# Patient Record
Sex: Female | Born: 1955 | Race: White | Hispanic: No | Marital: Married | Smoking: Never smoker
Health system: Southern US, Community
[De-identification: ages and names within clinical notes are randomized; demographics above are authoritative.]

## PROBLEM LIST (undated history)

## (undated) HISTORY — PX: TONSILLECTOMY: SUR1361

## (undated) HISTORY — PX: TENDON REPAIR: SHX5111

## (undated) HISTORY — PX: FOOT SURGERY: SHX648

## (undated) HISTORY — PX: ROTATOR CUFF REPAIR: SHX139

---

## 2006-03-13 ENCOUNTER — Inpatient Hospital Stay (HOSPITAL_COMMUNITY): Admission: AD | Admit: 2006-03-13 | Discharge: 2006-03-13 | Payer: Self-pay | Admitting: Obstetrics & Gynecology

## 2007-11-25 ENCOUNTER — Ambulatory Visit: Payer: Self-pay | Admitting: Internal Medicine

## 2010-08-10 ENCOUNTER — Ambulatory Visit: Payer: Self-pay

## 2011-06-14 ENCOUNTER — Ambulatory Visit: Payer: Self-pay

## 2011-11-22 ENCOUNTER — Emergency Department (HOSPITAL_BASED_OUTPATIENT_CLINIC_OR_DEPARTMENT_OTHER)
Admission: EM | Admit: 2011-11-22 | Discharge: 2011-11-22 | Disposition: A | Discharge status: Home / Self Care | Payer: Worker's Compensation | Attending: Emergency Medicine | Admitting: Emergency Medicine

## 2011-11-22 ENCOUNTER — Encounter (HOSPITAL_BASED_OUTPATIENT_CLINIC_OR_DEPARTMENT_OTHER): Payer: Self-pay

## 2011-11-22 ENCOUNTER — Emergency Department (INDEPENDENT_AMBULATORY_CARE_PROVIDER_SITE_OTHER): Payer: Worker's Compensation

## 2011-11-22 DIAGNOSIS — S298XXA Other specified injuries of thorax, initial encounter: Secondary | ICD-10-CM

## 2011-11-22 DIAGNOSIS — W010XXA Fall on same level from slipping, tripping and stumbling without subsequent striking against object, initial encounter: Secondary | ICD-10-CM

## 2011-11-22 DIAGNOSIS — R079 Chest pain, unspecified: Secondary | ICD-10-CM

## 2011-11-22 DIAGNOSIS — W19XXXA Unspecified fall, initial encounter: Secondary | ICD-10-CM

## 2011-11-22 DIAGNOSIS — Y9329 Activity, other involving ice and snow: Secondary | ICD-10-CM | POA: Insufficient documentation

## 2011-11-22 DIAGNOSIS — S20219A Contusion of unspecified front wall of thorax, initial encounter: Secondary | ICD-10-CM | POA: Insufficient documentation

## 2011-11-22 DIAGNOSIS — N644 Mastodynia: Secondary | ICD-10-CM

## 2011-11-22 MED ORDER — HYDROCODONE-ACETAMINOPHEN 5-500 MG PO TABS: 1.0000 | ORAL_TABLET | Freq: Four times a day (QID) | ORAL | Status: AC | PRN

## 2011-11-22 MED ORDER — HYDROCODONE-ACETAMINOPHEN 5-325 MG PO TABS
2.0000 | ORAL_TABLET | Freq: Once | ORAL | Status: AC
  Administered 2011-11-22: 2 via ORAL
  Filled 2011-11-22: qty 2

## 2011-11-22 NOTE — ED Notes (Signed)
Patient transported to X-ray 

## 2011-11-22 NOTE — ED Notes (Signed)
Pt reports she fell on the ice onto right breast.  She c/o right breast and right rib pain associated with SHOB.

## 2011-11-22 NOTE — ED Notes (Signed)
MD at bedside. 

## 2011-11-22 NOTE — ED Notes (Signed)
Pt ambulatory to BR

## 2011-11-22 NOTE — ED Provider Notes (Addendum)
History     CSN: [id number]  Arrival date & time 11/22/11  4098   First MD Initiated Contact with Patient 11/22/11 1028      Chief Complaint  Patient presents with  . Fall  . Breast Pain  . Rib Injury    (Consider location/radiation/quality/duration/timing/severity/associated sxs/prior treatment) Patient is a 56 y.o. female presenting with fall. The history is provided by the patient.  Fall Incident onset: today. The fall occurred while walking (slipped on ice and fell). She fell from a height of 1 to 2 ft. She landed on concrete. Point of impact: right breast. Pain location: right chest wall. The pain is moderate. She was ambulatory at the scene. She has tried nothing for the symptoms.    History reviewed. No pertinent past medical history.  Past Surgical History  Procedure Date  . Tonsillectomy   . Tendon repair     No family history on file.  History  Substance Use Topics  . Smoking status: Never Smoker   . Smokeless tobacco: Never Used  . Alcohol Use: No    OB History    Grav Para Term Preterm Abortions TAB SAB Ect Mult Living                  Review of Systems  All other systems reviewed and are negative.    Allergies  Penicillins  Home Medications  No current outpatient prescriptions on file.  BP 155/73  Pulse 55  Temp(Src) 97.7 F (36.5 C) (Oral)  Resp 16  Ht 5\' 3"  (1.6 m)  Wt 213 lb (96.616 kg)  BMI 37.73 kg/m2  SpO2 100%  Physical Exam  Nursing note and vitals reviewed. Constitutional: She is oriented to person, place, and time. She appears well-developed and well-nourished. No distress.  HENT:  Head: Normocephalic and atraumatic.  Neck: Normal range of motion. Neck supple.  Cardiovascular: Normal rate and regular rhythm.   No murmur heard. Pulmonary/Chest: Effort normal and breath sounds normal. No respiratory distress.       There is ttp over the right breast and ribs.  Abdominal: Soft. Bowel sounds are normal. She exhibits no  distension. There is no tenderness.  Musculoskeletal: Normal range of motion.  Neurological: She is alert and oriented to person, place, and time.  Skin: Skin is warm and dry. She is not diaphoretic.    ED Course  Procedures (including critical care time)  Labs Reviewed - No data to display Dg Chest 2 View  11/22/2011  *RADIOLOGY REPORT*  Clinical Data: Larey Seat on ice with chest and breast pain  CHEST - 2 VIEW  Comparison: None.  Findings: The lungs are clear.  No pneumothorax is seen. Mediastinal contours are normal.  The heart is within upper limits of normal.  No acute fracture is seen.  IMPRESSION: No active lung disease.  No acute fracture is noted.  Consider rib detail films if warranted clinically.  Original Report Authenticated By: Juline Patch, M.D.     No diagnosis found.    MDM  No evidence of rib fracture or ptx.  Will discharge with pain meds, time.        Geoffery Lyons, MD 11/22/11 1035  Geoffery Lyons, MD 12/06/11 [phone]

## 2011-11-26 ENCOUNTER — Other Ambulatory Visit: Payer: Self-pay | Admitting: Occupational Medicine

## 2011-11-26 ENCOUNTER — Ambulatory Visit: Payer: Worker's Compensation

## 2011-11-26 DIAGNOSIS — R52 Pain, unspecified: Secondary | ICD-10-CM

## 2012-03-10 ENCOUNTER — Ambulatory Visit: Payer: Self-pay | Admitting: Internal Medicine

## 2012-04-06 ENCOUNTER — Ambulatory Visit: Payer: Self-pay

## 2012-08-27 ENCOUNTER — Ambulatory Visit: Payer: Self-pay | Admitting: Physician Assistant

## 2012-09-23 ENCOUNTER — Ambulatory Visit: Payer: Self-pay | Admitting: Internal Medicine

## 2012-11-18 IMAGING — CR DG HUMERUS 2V *R*
3 series · 3 of 3 positions shown · non-contrast
Comparison: 11/26/2011

CLINICAL DATA: The patient fell 11/22/2011.  Pain.

RIGHT HUMERUS - 2+ VIEW

[view not recorded (1 of 3)]
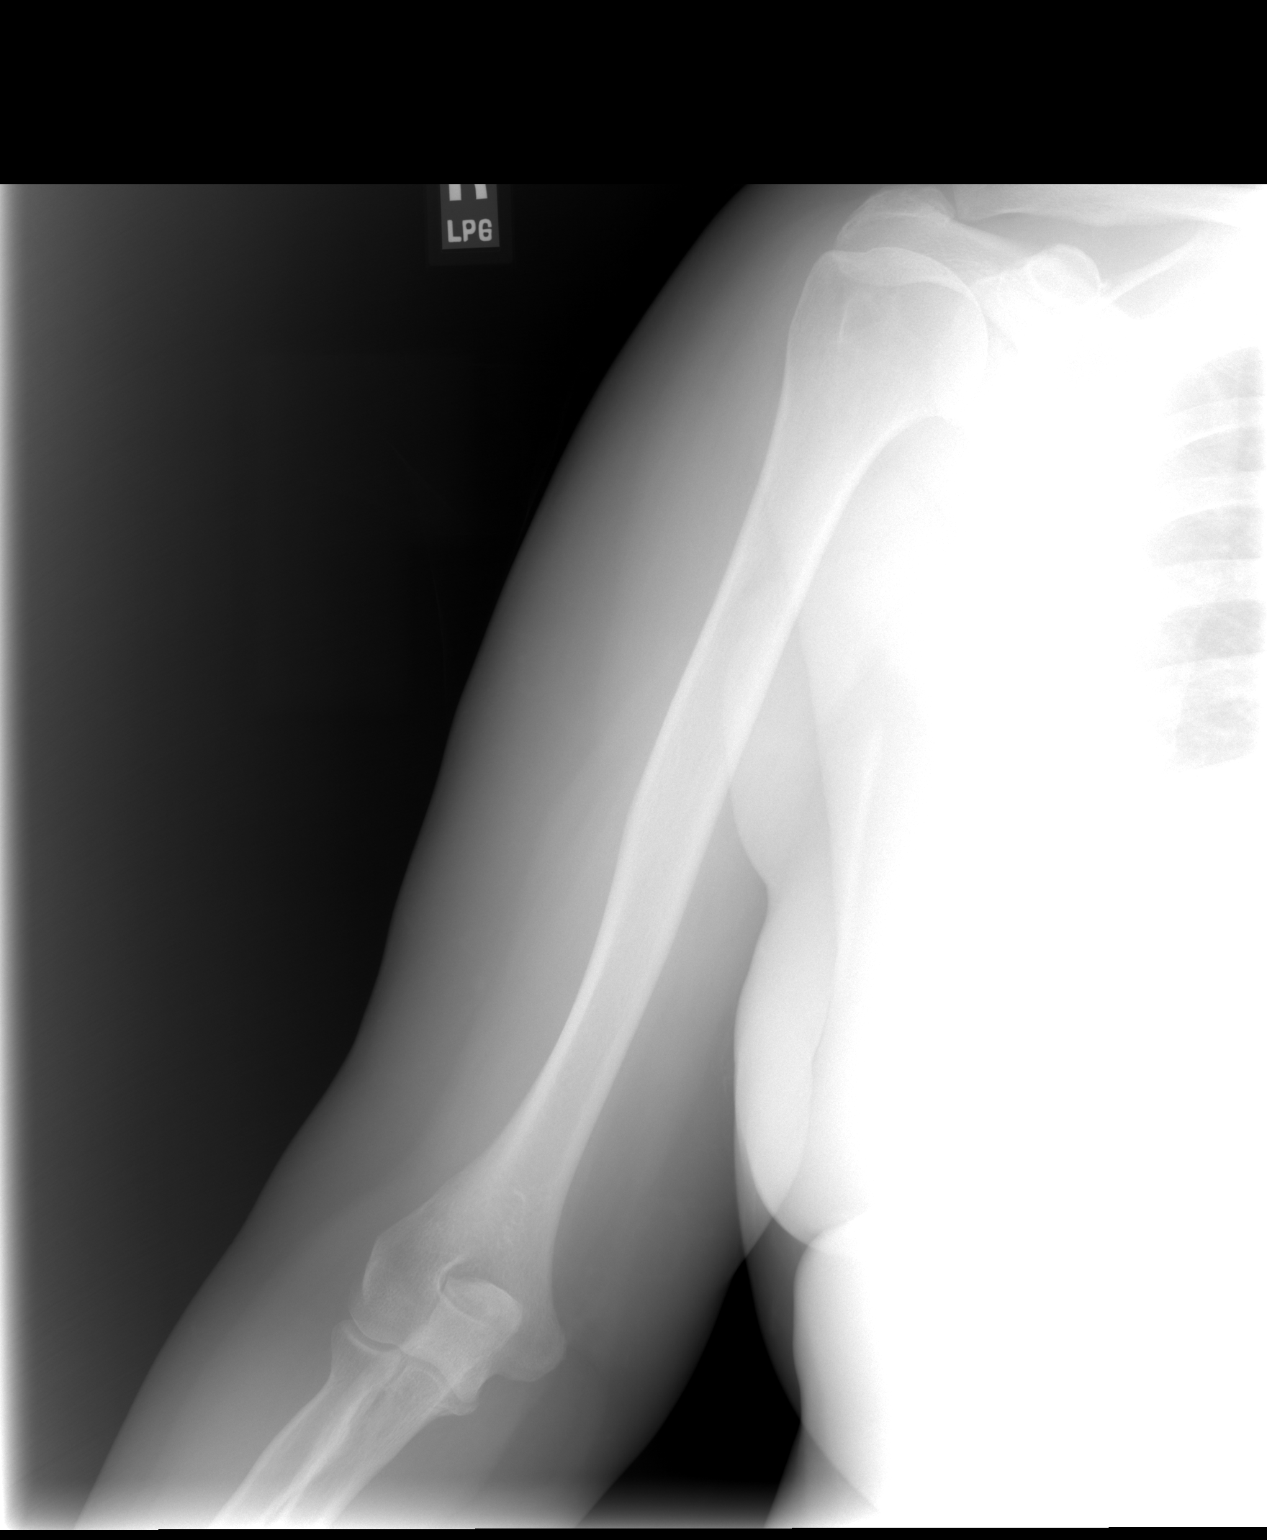

[view not recorded (2 of 3)]
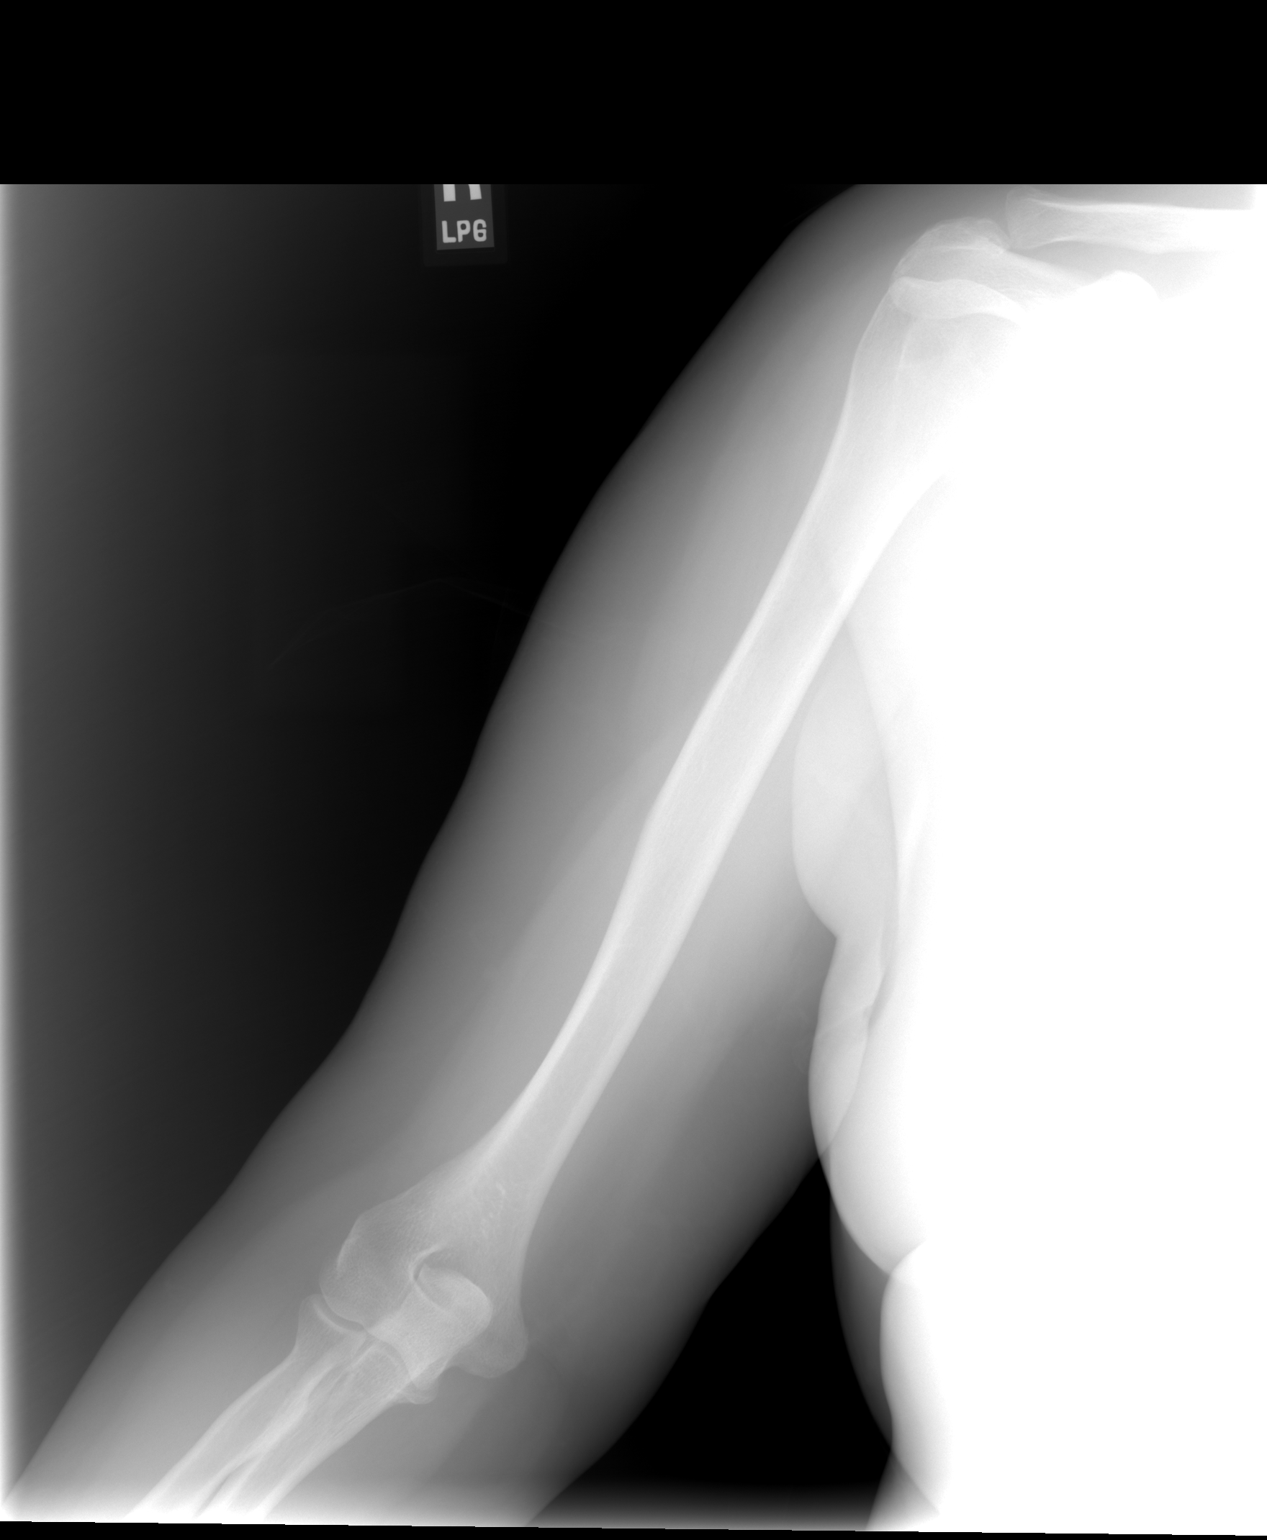

[view not recorded (3 of 3)]
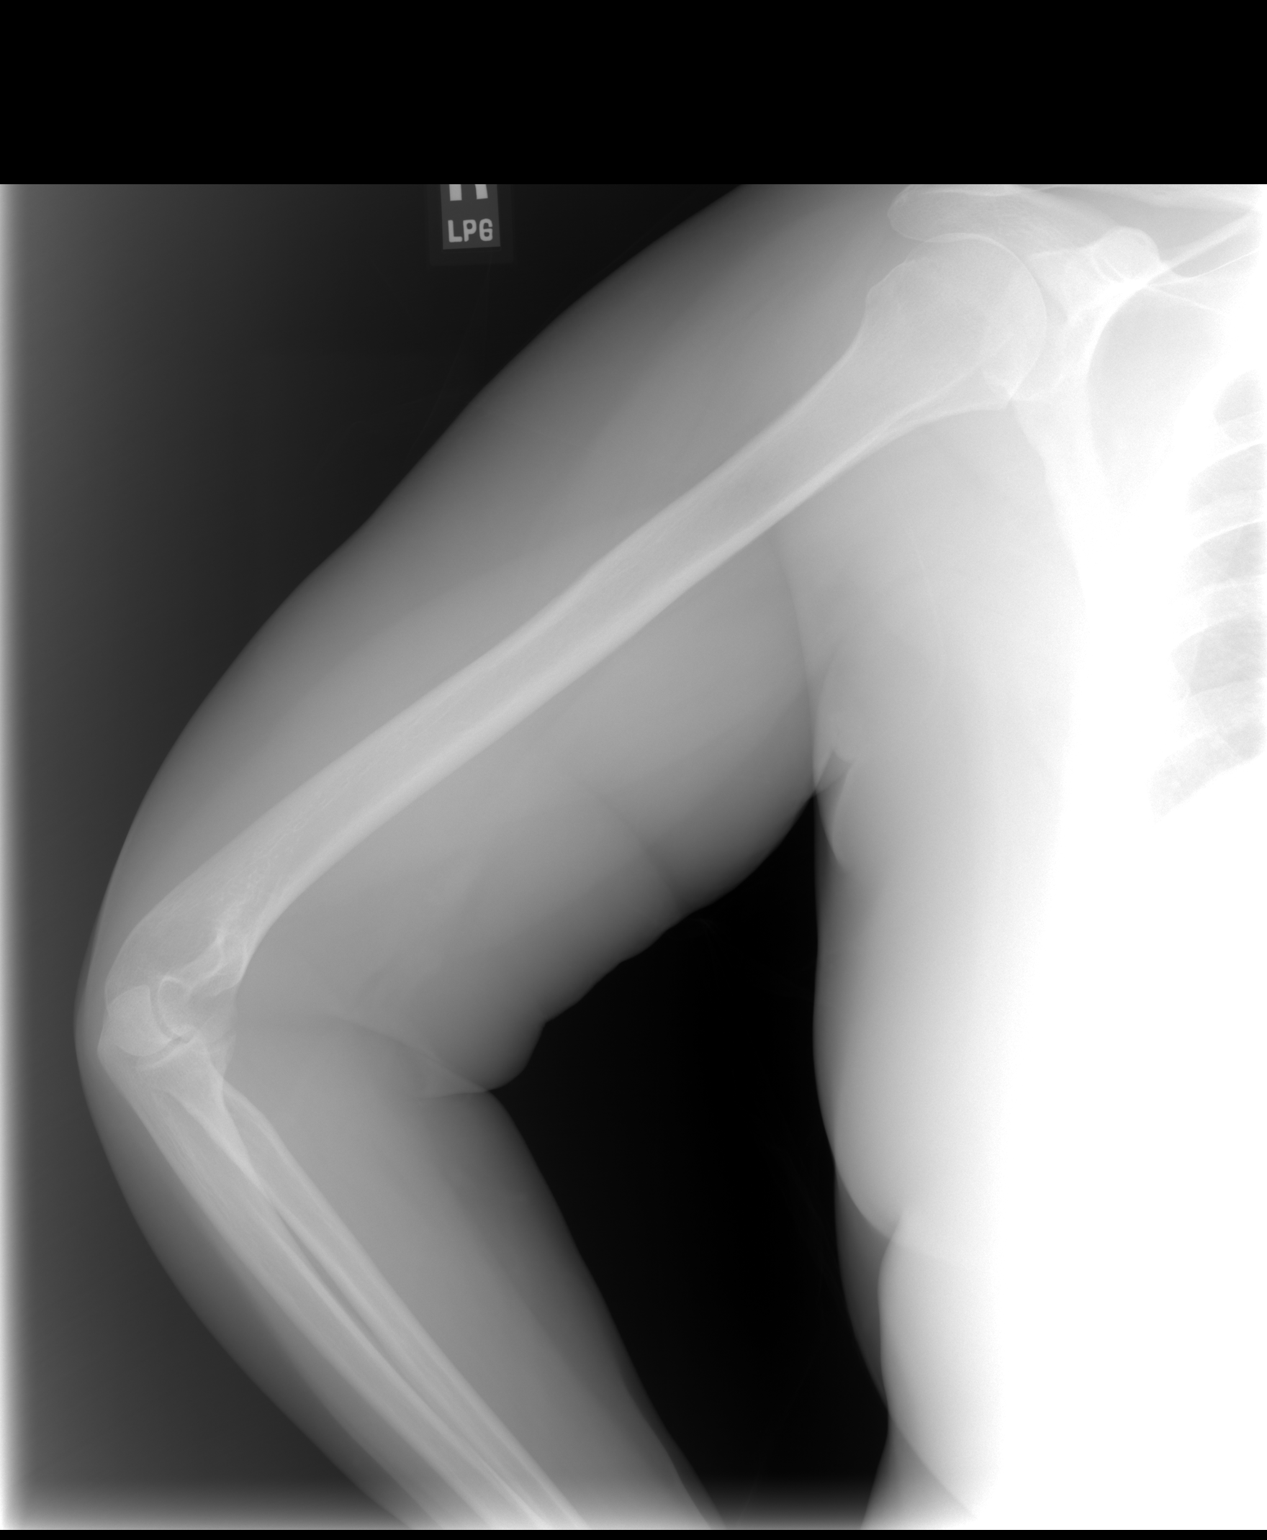

[3 of 3 positions shown; findings below may reference images not displayed]

FINDINGS: There is no evidence for acute fracture or dislocation.
No soft tissue foreign body or gas identified.
IMPRESSION: Negative exam.

## 2015-09-04 ENCOUNTER — Emergency Department
Admission: EM | Admit: 2015-09-04 | Discharge: 2015-09-05 | Disposition: A | Discharge status: Home / Self Care | Payer: 59 | Attending: Emergency Medicine | Admitting: Emergency Medicine

## 2015-09-04 ENCOUNTER — Emergency Department: Payer: 59

## 2015-09-04 ENCOUNTER — Encounter: Payer: Self-pay | Admitting: Emergency Medicine

## 2015-09-04 DIAGNOSIS — S79912A Unspecified injury of left hip, initial encounter: Secondary | ICD-10-CM | POA: Insufficient documentation

## 2015-09-04 DIAGNOSIS — Y998 Other external cause status: Secondary | ICD-10-CM | POA: Insufficient documentation

## 2015-09-04 DIAGNOSIS — Y9389 Activity, other specified: Secondary | ICD-10-CM | POA: Insufficient documentation

## 2015-09-04 DIAGNOSIS — Y92410 Unspecified street and highway as the place of occurrence of the external cause: Secondary | ICD-10-CM | POA: Diagnosis not present

## 2015-09-04 DIAGNOSIS — Z880 Allergy status to penicillin: Secondary | ICD-10-CM | POA: Insufficient documentation

## 2015-09-04 DIAGNOSIS — T148 Other injury of unspecified body region: Secondary | ICD-10-CM | POA: Insufficient documentation

## 2015-09-04 DIAGNOSIS — T148XXA Other injury of unspecified body region, initial encounter: Secondary | ICD-10-CM

## 2015-09-04 DIAGNOSIS — S199XXA Unspecified injury of neck, initial encounter: Secondary | ICD-10-CM | POA: Diagnosis present

## 2015-09-04 LAB — COMPREHENSIVE METABOLIC PANEL
ALT: 13 U/L — AB (ref 14–54)
AST: 18 U/L (ref 15–41)
Albumin: 3.9 g/dL (ref 3.5–5.0)
Alkaline Phosphatase: 75 U/L (ref 38–126)
Anion gap: 3 — ABNORMAL LOW (ref 5–15)
BILIRUBIN TOTAL: 0.9 mg/dL (ref 0.3–1.2)
BUN: 11 mg/dL (ref 6–20)
CO2: 29 mmol/L (ref 22–32)
CREATININE: 0.74 mg/dL (ref 0.44–1.00)
Calcium: 8.8 mg/dL — ABNORMAL LOW (ref 8.9–10.3)
Chloride: 109 mmol/L (ref 101–111)
GFR calc Af Amer: >60 mL/min (ref >60)
Glucose, Bld: 95 mg/dL (ref 65–99)
POTASSIUM: 3.7 mmol/L (ref 3.5–5.1)
Sodium: 141 mmol/L (ref 135–145)
TOTAL PROTEIN: 6.6 g/dL (ref 6.5–8.1)

## 2015-09-04 LAB — TYPE AND SCREEN
ABO/RH(D): O POS
Antibody Screen: NEGATIVE

## 2015-09-04 LAB — CBC WITH DIFFERENTIAL/PLATELET
BASOS ABS: 0 10*3/uL (ref 0–0.1)
Basophils Relative: 1 %
Eosinophils Absolute: 0.1 10*3/uL (ref 0–0.7)
Eosinophils Relative: 2 %
HEMATOCRIT: 40 % (ref 35.0–47.0)
Hemoglobin: 13.9 g/dL (ref 12.0–16.0)
LYMPHS ABS: 2.3 10*3/uL (ref 1.0–3.6)
LYMPHS PCT: 39 %
MCH: 32.1 pg (ref 26.0–34.0)
MCHC: 34.6 g/dL (ref 32.0–36.0)
MCV: 92.7 fL (ref 80.0–100.0)
MONO ABS: 0.5 10*3/uL (ref 0.2–0.9)
Monocytes Relative: 9 %
NEUTROS ABS: 2.9 10*3/uL (ref 1.4–6.5)
Neutrophils Relative %: 49 %
Platelets: 299 10*3/uL (ref 150–440)
RBC: 4.32 MIL/uL (ref 3.80–5.20)
RDW: 12.1 % (ref 11.5–14.5)
WBC: 5.7 10*3/uL (ref 3.6–11.0)

## 2015-09-04 MED ORDER — IOHEXOL 300 MG/ML  SOLN
100.0000 mL | Freq: Once | INTRAMUSCULAR | Status: AC | PRN
  Administered 2015-09-04: 100 mL via INTRAVENOUS
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
## 2015-09-05 LAB — ABO/RH: ABO/RH(D): O POS

## 2016-03-30 ENCOUNTER — Encounter: Payer: Self-pay | Admitting: Emergency Medicine

## 2016-03-30 ENCOUNTER — Emergency Department: Payer: 59

## 2016-03-30 ENCOUNTER — Emergency Department
Admission: EM | Admit: 2016-03-30 | Discharge: 2016-03-30 | Disposition: A | Discharge status: Home / Self Care | Payer: 59 | Attending: Emergency Medicine | Admitting: Emergency Medicine

## 2016-03-30 DIAGNOSIS — Y999 Unspecified external cause status: Secondary | ICD-10-CM | POA: Insufficient documentation

## 2016-03-30 DIAGNOSIS — S60212A Contusion of left wrist, initial encounter: Secondary | ICD-10-CM

## 2016-03-30 DIAGNOSIS — Y939 Activity, unspecified: Secondary | ICD-10-CM | POA: Diagnosis not present

## 2016-03-30 DIAGNOSIS — Z23 Encounter for immunization: Secondary | ICD-10-CM | POA: Insufficient documentation

## 2016-03-30 DIAGNOSIS — W19XXXA Unspecified fall, initial encounter: Secondary | ICD-10-CM

## 2016-03-30 DIAGNOSIS — S9032XA Contusion of left foot, initial encounter: Secondary | ICD-10-CM | POA: Diagnosis not present

## 2016-03-30 DIAGNOSIS — S8001XA Contusion of right knee, initial encounter: Secondary | ICD-10-CM | POA: Insufficient documentation

## 2016-03-30 DIAGNOSIS — W010XXA Fall on same level from slipping, tripping and stumbling without subsequent striking against object, initial encounter: Secondary | ICD-10-CM | POA: Diagnosis not present

## 2016-03-30 DIAGNOSIS — Y92480 Sidewalk as the place of occurrence of the external cause: Secondary | ICD-10-CM | POA: Insufficient documentation

## 2016-03-30 DIAGNOSIS — S8991XA Unspecified injury of right lower leg, initial encounter: Secondary | ICD-10-CM | POA: Diagnosis present

## 2016-03-30 MED ORDER — TETANUS-DIPHTH-ACELL PERTUSSIS 5-2.5-18.5 LF-MCG/0.5 IM SUSP
0.5000 mL | Freq: Once | INTRAMUSCULAR | Status: AC
  Administered 2016-03-30: 0.5 mL via INTRAMUSCULAR
  Filled 2016-03-30: qty 0.5
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
## 2016-03-30 NOTE — Discharge Instructions (Signed)
Periosteal Hematoma Periosteal hematoma (bone bruise) is a localized, tender, raised area close to the bone. It can occur from a small hidden fracture of the bone, following surgery, or from other trauma to the area. It typically occurs in bones located close to the surface of the skin, such as the shin, knee, and heel bone. Although it may take 2 or more weeks to completely heal, bone bruises typically are not associated with permanent or serious damage to the bone. If you are taking blood thinners, you may be at greater risk for such injuries.  CAUSES  A bone bruise is usually caused by high-impact trauma to the bone, but it can be caused by sports injuries or twisting injuries. SIGNS AND SYMPTOMS   Severe pain around the injured area that typically lasts longer than a normal bruise.  Difficulty using the bruised area.  Tender, raised area close to the bone.  Discoloration or swelling of the bruised area. DIAGNOSIS  You may need an MRI of the injured area to confirm a bone bruise if your health care provider feels it is necessary. A regular X-ray will not detect a bone bruise, but it will detect a broken bone (fracture). An X-ray may be taken to rule out any fractures. TREATMENT  Often, the best treatment for a bone bruise is resting, icing, and applying cold compresses to the injured area. Over-the-counter medicines may also be recommended for pain control. HOME CARE INSTRUCTIONS  Some things you can do to improve the condition are:   Rest and elevate the area of injury as long as it is very tender or swollen.  Apply ice to the injured area:  Put ice in a plastic bag.  Place a towel between your skin and the bag.  Leave the ice on for 20 minutes, 2-3 times a day.  Use an elastic wrap to reduce swelling and protect the injured area. Make sure it is not applied too tightly. If the area around the wrap becomes cold or blue, the wrap is too tight. Wrap it more loosely.  For  activity:  Follow your health care provider's instructions about whether walking with crutches is required. This will depend on how serious your condition is.  Start weight bearing gradually on the bruised part.  Continue to use crutches or a cane until you can stand without causing pain, or as instructed.  If a plaster splint was applied:  Wear the splint until you are seen for a follow-up exam.  Rest it on nothing harder than a pillow the first 24 hours.  Do not put weight on it.  Do not get it wet. You may take it off to take a shower or bath.  You may have been given an elastic bandage to use with or without the plaster splint. The splint is too tight if you have numbness or tingling, or if the skin around the bandage becomes cold and blue. Adjust the bandage to make it comfortable.  If an air splint was applied:  You may alter the amount of air in the splint as needed for comfort.  You may take it off at night and to take a shower or bath.  If the injury was in either leg, wiggle your toes in the splint several times per day if you are able.  Only take over-the-counter or prescription medicines for pain, discomfort, or fever as directed by your health care provider.  Keep all follow-up visits with your health care provider. This includes   any orthopedic referrals, physical therapy, and rehabilitation. Any delay in getting necessary care could result in a delay or failure of the bones to heal. SEEK MEDICAL CARE IF:   You have an increase in bruising, swelling, tenderness, heat, or pain over your injury.  You notice coldness of your toes that does not improve after removing a splint or bandage.  Your pain is not lessened after you take medicine.  You have increased difficulty bearing weight on the injured leg, if the injury is in either leg. SEEK IMMEDIATE MEDICAL CARE IF:   You have severe pain near the injured area or severe pain with stretching.  You have increased  swelling that resulted in a tense, hard area or a loss of sensation in the area of the injury.  You have pale, cool skin below the area of the injury (in an extremity) that does not go away after removing a splint or bandage. MAKE SURE YOU:   Understand these instructions.  Will watch your condition.  Will get help right away if you are not doing well or get worse.   This information is not intended to replace advice given to you by your health care provider. Make sure you discuss any questions you have with your health care provider.   Document Released: 11/28/2004 Document Revised: 08/11/2013 Document Reviewed: 04/09/2013 Elsevier Interactive Patient Education 2016 Elsevier Inc.  

## 2016-03-30 NOTE — ED Provider Notes (Signed)
[facility] Emergency Department Provider Note  ____________________________________________  Time seen: Approximately 6:03 PM  I have reviewed the triage vital signs and the nursing notes.   HISTORY  Chief Complaint Fall and Knee Pain    HPI April Manning is a 60 y.o. female who presents to emergency department complaining of left wrist, right knee, left foot pain. Patient states that she fell after tripping on a sidewalk and landed on her bilateral wrists and knees. Patient states that she has abrasions to bilateral palms, left wrist pain, right knee pain, and left foot pain. Patient did not hit her head or lose consciousness. Patient's last tetanus shot is unknown. Patient has not taken any medications prior to arrival in the emergency department for symptom relief.   History reviewed. No pertinent past medical history.  There are no active problems to display for this patient.   Past Surgical History  Procedure Laterality Date  . Tonsillectomy    . Tendon repair    . Rotator cuff repair    . Foot surgery      No current outpatient prescriptions on file.  Allergies Penicillins  History reviewed. No pertinent family history.  Social History Social History  Substance Use Topics  . Smoking status: Never Smoker   . Smokeless tobacco: Never Used  . Alcohol Use: No     Review of Systems  Constitutional: No fever/chills. Cardiovascular: no chest pain. Respiratory: no cough. No SOB. Musculoskeletal: Positive for left wrist, right knee, left foot pain. Skin: Positive for abrasions to bilateral palms. Neurological: Negative for headaches, focal weakness or numbness. 10-point ROS otherwise negative.  ____________________________________________   PHYSICAL EXAM:  VITAL SIGNS: ED Triage Vitals  Enc Vitals Group     BP 03/30/16 1742 158/94 mmHg     Pulse Rate 03/30/16 1742 88     Resp 03/30/16 1742 20     Temp 03/30/16 1742 98.4 F  (36.9 C)     Temp Source 03/30/16 1742 Oral     SpO2 03/30/16 1742 97 %     Weight 03/30/16 1742 220 lb (99.791 kg)     Height 03/30/16 1742 5\' 3"  (1.6 m)     Head Cir --      Peak Flow --      Pain Score 03/30/16 1743 8     Pain Loc --      Pain Edu? --      Excl. in GC? --      Constitutional: Alert and oriented. Well appearing and in no acute distress. Eyes: Conjunctivae are normal. PERRL. EOMI. Head: Atraumatic. Cardiovascular: Normal rate, regular rhythm. Normal S1 and S2.  Good peripheral circulation. Respiratory: Normal respiratory effort without tachypnea or retractions. Lungs CTAB. Good air entry to the bases with no decreased or absent breath sounds. Musculoskeletal: Full range of motion to all extremities. No gross deformities appreciated. Patient has mild ecchymosis noted to the base to the thenar eminence. Patient is tender to palpation in this region. No palpable abnormality. Full range of motion 5 digits left hand. Full range of motion of wrist. Radial pulse and cap refill intact 5 digits. Sensation intact all 5 digits of the left hand. No visible deformity to the right knee but inspection. Mild edema is noted to the anterolateral aspect of the right knee. Full range of motion to the knee. Patient is tender to palpation over the anterolateral aspect of the knee. Varus, valgus, Lachman's, McMurray's is negative the knee. Dorsalis pedis pulses  appreciated distally. Sensation intact distally. No visible deformity to the left foot upon inspection. No ecchymosis is noted. Minor superficial laceration is noted to the medial aspect of the left toenail. This does not extend into the toenail. Does not include nailbed. No bleeding at this time. No visible foreign body. Full range of motion to left foot and all toes left foot. Patient is tender to palpation over the MTP joint of the date toe left foot. No palpable abnormality. Cap refill intact 5 digits. Sensation intact 5  digits. Neurologic:  Normal speech and language. No gross focal neurologic deficits are appreciated.  Skin:  Skin is warm, dry and intact. No rash noted. Psychiatric: Mood and affect are normal. Speech and behavior are normal. Patient exhibits appropriate insight and judgement.   ____________________________________________   LABS (all labs ordered are listed, but only abnormal results are displayed)  Labs Reviewed - No data to display ____________________________________________  EKG   ____________________________________________  RADIOLOGY April Manning April Manning, personally viewed and evaluated these images (plain radiographs) as part of my medical decision making, as well as reviewing the written report by the radiologist.  Dg Wrist Complete Left  03/30/2016  CLINICAL DATA:  Fall with left wrist pain EXAM: LEFT WRIST - COMPLETE 3+ VIEW COMPARISON:  None. FINDINGS: Metallic ring obscures visualization of the proximal left fourth finger. No fracture, dislocation, suspicious focal osseous lesion, appreciable arthropathy or additional radiopaque foreign body. IMPRESSION: No fracture or malalignment in the left wrist. Electronically Signed   By: Delbert Phenix M.D.   On: 03/30/2016 18:36   Dg Knee Complete 4 Views Right  03/30/2016  CLINICAL DATA:  Status post trip and fall, with generalized right knee pain. Initial encounter. EXAM: RIGHT KNEE - COMPLETE 4+ VIEW COMPARISON:  None. FINDINGS: There is no evidence of fracture or dislocation. The joint spaces are preserved. Tibial spine and wall osteophytes are seen. A fabella is noted. No significant joint effusion is seen. The visualized soft tissues are normal in appearance. IMPRESSION: 1. No evidence of fracture or dislocation. 2. Minimal degenerative change at the right knee. Electronically Signed   By: Roanna Raider M.D.   On: 03/30/2016 18:36   Dg Foot Complete Left  03/30/2016  CLINICAL DATA:  Tripped and fell, with left great toe  pain, swelling and bruising. Initial encounter. EXAM: LEFT FOOT - COMPLETE 3+ VIEW COMPARISON:  None. FINDINGS: There is no evidence of fracture or dislocation. The joint spaces are preserved. There is no evidence of talar subluxation; the subtalar joint is unremarkable in appearance. There is a bipartite medial sesamoid of the first toe. The known soft tissue laceration is not well characterized on radiograph. No radiopaque foreign bodies are seen. IMPRESSION: 1. No evidence of fracture or dislocation. 2. Bipartite medial sesamoid of the first toe. Electronically Signed   By: Roanna Raider M.D.   On: 03/30/2016 18:37    ____________________________________________    PROCEDURES  Procedure(s) performed:       Medications  Tdap (BOOSTRIX) injection 0.5 mL (0.5 mLs Intramuscular Given 03/30/16 1846)     ____________________________________________   INITIAL IMPRESSION / ASSESSMENT AND PLAN / ED COURSE  Pertinent labs & imaging results that were available during my care of the patient were reviewed by me and considered in my medical decision making (see chart for details).  Patient's diagnosis is consistent with Fall resulting in left wrist contusion, right knee contusion, left foot contusion. Patient declines any prescriptions at this time stating that she will  use essential oils for symptom relief. Exam is reassuring. Patient will follow-up with orthopedics if needed..Patient is given ED precautions to return to the ED for any worsening or new symptoms.     ____________________________________________  FINAL CLINICAL IMPRESSION(S) / ED DIAGNOSES  Final diagnoses:  Fall, initial encounter  Knee contusion, right, initial encounter  Wrist contusion, left, initial encounter  Foot contusion, left, initial encounter      NEW MEDICATIONS STARTED DURING THIS VISIT:  New Prescriptions   No medications on file        This chart was dictated using voice recognition  software/Dragon. Despite best efforts to proofread, errors can occur which can change the meaning. Any change was purely unintentional.    Racheal Patches, PA-C 03/30/16 1916  Jeanmarie Plant, MD 03/31/16 [id number]

## 2016-06-11 DIAGNOSIS — M24132 Other articular cartilage disorders, left wrist: Secondary | ICD-10-CM | POA: Insufficient documentation

## 2016-06-11 DIAGNOSIS — G5602 Carpal tunnel syndrome, left upper limb: Secondary | ICD-10-CM | POA: Insufficient documentation

## 2016-06-11 DIAGNOSIS — M1812 Unilateral primary osteoarthritis of first carpometacarpal joint, left hand: Secondary | ICD-10-CM | POA: Insufficient documentation

## 2017-03-23 IMAGING — CR DG FOOT COMPLETE 3+V*L*
1 series · 3 of 3 positions shown · non-contrast
Comparison: None.

CLINICAL DATA: Tripped and fell, with left great toe pain, swelling
and bruising. Initial encounter.

EXAM:
LEFT FOOT - COMPLETE 3+ VIEW

[Series 1: dg foot complete left · 0.14mm/px · 3 of 3 slices shown]
[im 1/3]
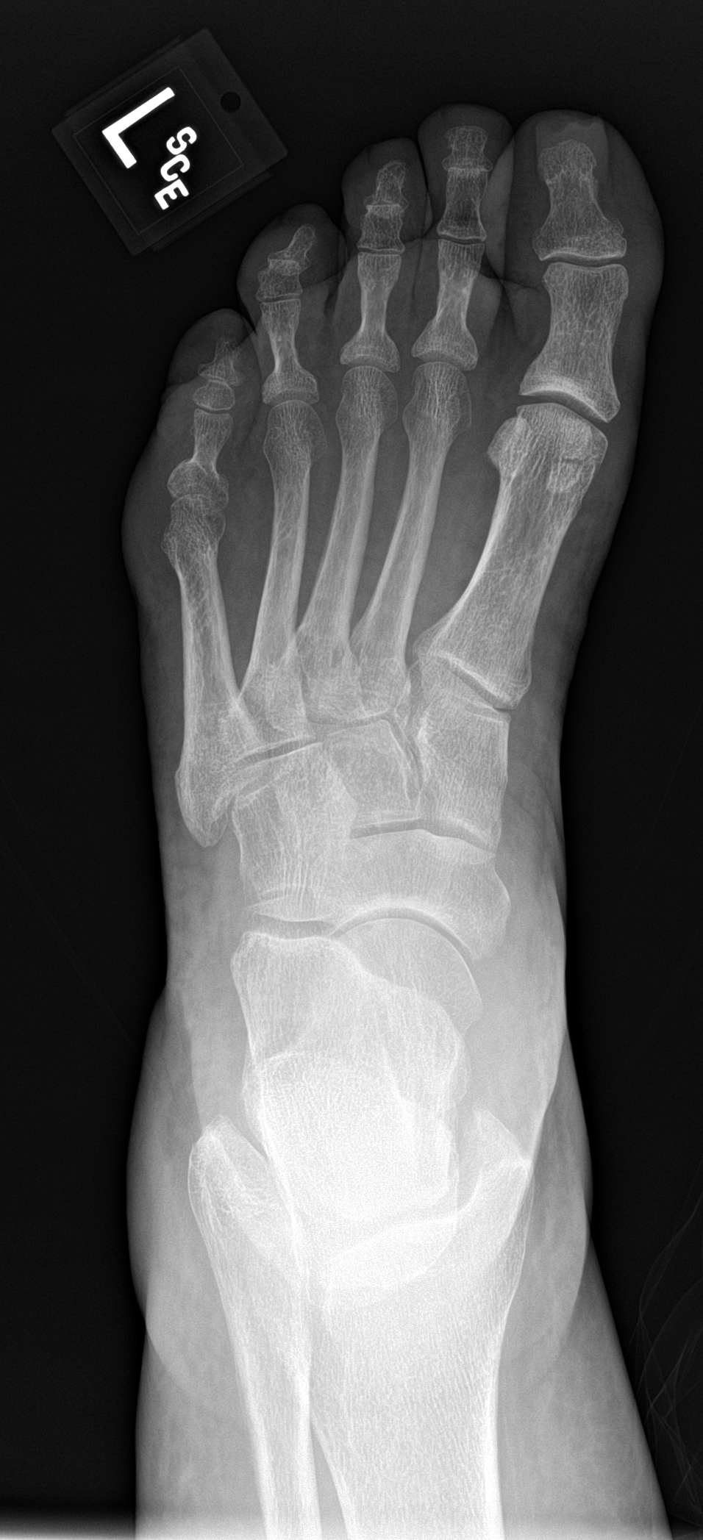
[im 2/3]
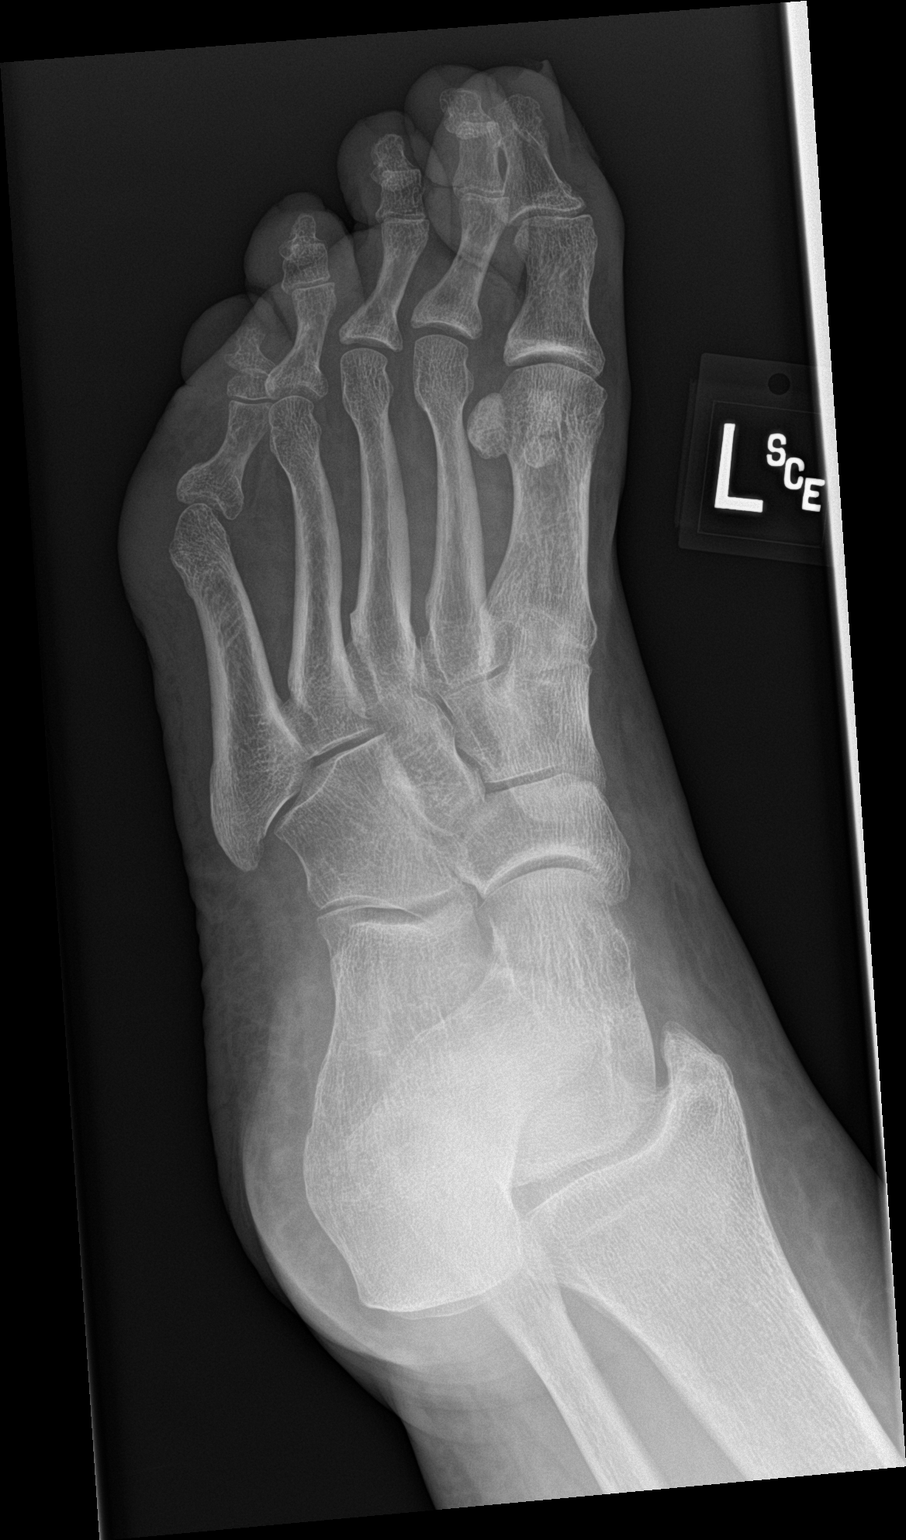
[im 3/3]
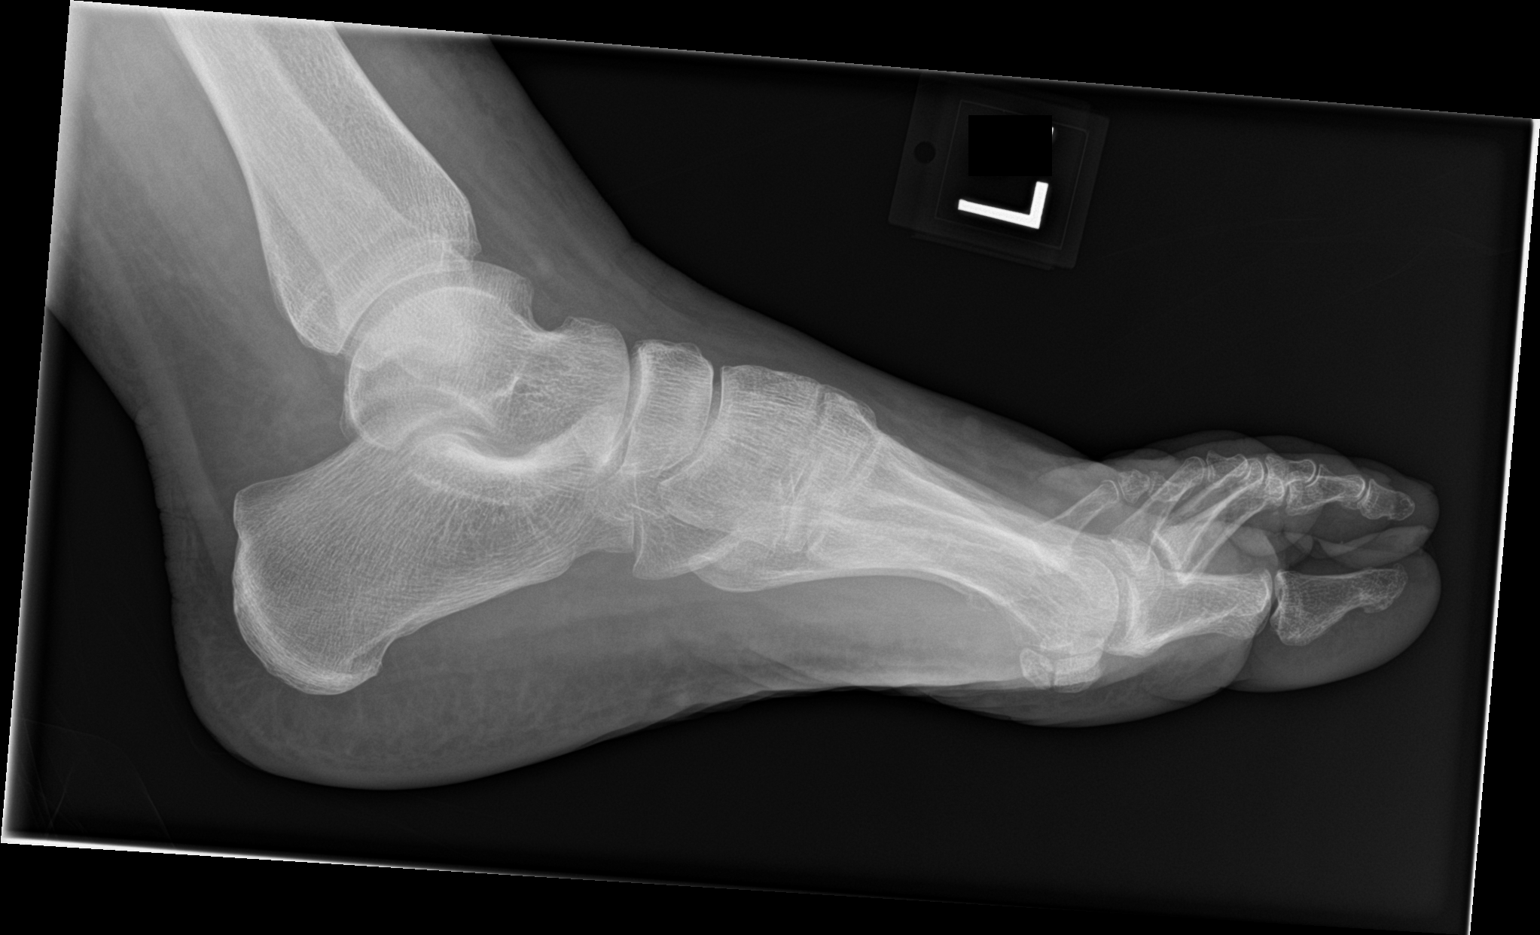

[3 of 3 positions shown; findings below may reference images not displayed]

FINDINGS: There is no evidence of fracture or dislocation. The joint spaces
are preserved. There is no evidence of talar subluxation; the
subtalar joint is unremarkable in appearance. There is a bipartite
medial sesamoid of the first toe.

The known soft tissue laceration is not well characterized on
radiograph. No radiopaque foreign bodies are seen.
IMPRESSION: 1. No evidence of fracture or dislocation.
2. Bipartite medial sesamoid of the first toe.

## 2018-02-26 DIAGNOSIS — M7502 Adhesive capsulitis of left shoulder: Secondary | ICD-10-CM | POA: Insufficient documentation

## 2018-03-19 DIAGNOSIS — S52602G Unspecified fracture of lower end of left ulna, subsequent encounter for closed fracture with delayed healing: Secondary | ICD-10-CM | POA: Insufficient documentation

## 2018-07-01 ENCOUNTER — Other Ambulatory Visit: Payer: Self-pay | Admitting: Orthopedic Surgery

## 2018-07-01 DIAGNOSIS — M25512 Pain in left shoulder: Secondary | ICD-10-CM

## 2018-07-04 ENCOUNTER — Other Ambulatory Visit: Payer: Self-pay

## 2018-07-14 ENCOUNTER — Ambulatory Visit
Admission: RE | Admit: 2018-07-14 | Discharge: 2018-07-14 | Disposition: A | Discharge status: Home / Self Care | Payer: BLUE CROSS/BLUE SHIELD | Source: Ambulatory Visit | Attending: Orthopedic Surgery | Admitting: Orthopedic Surgery

## 2018-07-14 DIAGNOSIS — M25512 Pain in left shoulder: Secondary | ICD-10-CM

## 2021-08-01 ENCOUNTER — Other Ambulatory Visit: Payer: Self-pay | Admitting: Podiatry

## 2021-08-01 ENCOUNTER — Ambulatory Visit (INDEPENDENT_AMBULATORY_CARE_PROVIDER_SITE_OTHER): Payer: Medicare Other

## 2021-08-01 ENCOUNTER — Ambulatory Visit: Payer: Medicare Other | Admitting: Podiatry

## 2021-08-01 ENCOUNTER — Encounter: Payer: Self-pay | Admitting: Podiatry

## 2021-08-01 ENCOUNTER — Other Ambulatory Visit: Payer: Self-pay

## 2021-08-01 DIAGNOSIS — S9031XA Contusion of right foot, initial encounter: Secondary | ICD-10-CM

## 2021-08-01 DIAGNOSIS — S92504A Nondisplaced unspecified fracture of right lesser toe(s), initial encounter for closed fracture: Secondary | ICD-10-CM | POA: Diagnosis not present

## 2021-08-01 DIAGNOSIS — S9032XA Contusion of left foot, initial encounter: Secondary | ICD-10-CM

## 2021-08-01 DIAGNOSIS — S92354A Nondisplaced fracture of fifth metatarsal bone, right foot, initial encounter for closed fracture: Secondary | ICD-10-CM

## 2021-08-01 NOTE — Progress Notes (Signed)
  Subjective:  Patient ID: April Manning, female    DOB: Apr 21, 1956,  MRN: [id number] HPI Chief Complaint  Patient presents with   Foot Injury    5th toe/MPJ right - hit toe twice 6-7 weeks ago, sharp, shooting pains,can't wear enclosed shoes, noticed 4th and 5th toes are "leaning"   New Patient (Initial Visit)    65 y.o. female presents with the above complaint.   ROS: Denies fever chills nausea vomiting muscle aches pains calf pain back pain chest pain shortness of breath.  No past medical history on file. Past Surgical History:  Procedure Laterality Date   FOOT SURGERY     ROTATOR CUFF REPAIR     TENDON REPAIR     TONSILLECTOMY     No current outpatient medications on file.  Allergies  Allergen Reactions   Penicillins Hives   Review of Systems Objective:  There were no vitals filed for this visit.  General: Well developed, nourished, in no acute distress, alert and oriented x3   Dermatological: Skin is warm, dry and supple bilateral. Nails x 10 are well maintained; remaining integument appears unremarkable at this time. There are no open sores, no preulcerative lesions, no rash or signs of infection present.  Vascular: Dorsalis Pedis artery and Posterior Tibial artery pedal pulses are 2/4 bilateral with immedate capillary fill time. Pedal hair growth present. No varicosities and no lower extremity edema present bilateral.   Neruologic: Grossly intact via light touch bilateral. Vibratory intact via tuning fork bilateral. Protective threshold with Semmes Wienstein monofilament intact to all pedal sites bilateral. Patellar and Achilles deep tendon reflexes 2+ bilateral. No Babinski or clonus noted bilateral.   Musculoskeletal: No gross boney pedal deformities bilateral. No pain, crepitus, or limitation noted with foot and ankle range of motion bilateral. Muscular strength 5/5 in all groups tested bilateral.  Red swollen fifth digit right foot pain on palpation to the  fifth toe as well as the fifth metatarsal head.  Also demonstrates hammertoe deformity fourth toe bilateral  Gait: Unassisted, Nonantalgic.    Radiographs:  Radiographs taken today demonstrate a fractured mid diaphyseal region of the fifth digit right foot it is dislocated minimally comminuted also demonstrates a fracture at the fifth met head.  Assessment & Plan:   Assessment: Fractured fifth metatarsal and fifth toe right  Plan: Discussed etiology pathology conservative surgical therapies at this point placed her in a Darco shoe we will follow-up with her in 6 weeks for another set of x-rays     April Manning T. [place], [place]

## 2021-09-12 ENCOUNTER — Other Ambulatory Visit: Payer: Self-pay

## 2021-09-12 ENCOUNTER — Ambulatory Visit: Payer: Medicare Other | Admitting: Podiatry

## 2021-09-12 ENCOUNTER — Encounter (INDEPENDENT_AMBULATORY_CARE_PROVIDER_SITE_OTHER): Payer: Self-pay

## 2021-09-12 ENCOUNTER — Encounter: Payer: Self-pay | Admitting: Podiatry

## 2021-09-12 ENCOUNTER — Ambulatory Visit (INDEPENDENT_AMBULATORY_CARE_PROVIDER_SITE_OTHER): Payer: Medicare Other

## 2021-09-12 DIAGNOSIS — S92504D Nondisplaced unspecified fracture of right lesser toe(s), subsequent encounter for fracture with routine healing: Secondary | ICD-10-CM | POA: Diagnosis not present

## 2021-09-12 DIAGNOSIS — I872 Venous insufficiency (chronic) (peripheral): Secondary | ICD-10-CM | POA: Diagnosis not present

## 2021-09-12 DIAGNOSIS — S92354D Nondisplaced fracture of fifth metatarsal bone, right foot, subsequent encounter for fracture with routine healing: Secondary | ICD-10-CM

## 2021-09-12 NOTE — Progress Notes (Signed)
She presents today for follow-up of a fracture of her fifth toe right foot.  States that it feels fine as long as I keep the shoe on.  Objective: Vital signs are stable she is alert and oriented x3.  Pulses are palpable.  Much decrease in edema along the fifth toe no ecchymosis no open lesions or wounds are identified.  Mild tenderness on palpation of the proximal phalanx.  Radiographs taken today demonstrate stabilization of the fifth proximal phalanx mid diaphyseal fracture with a bony callus.  Assessment: Well-healing fracture fifth toe right.  Plan: Recommended that she just follow-up with me on an as-needed basis wear the Darco shoe for about another 2 to 3 weeks and then get back into her regular shoes.

## 2021-09-25 ENCOUNTER — Encounter (INDEPENDENT_AMBULATORY_CARE_PROVIDER_SITE_OTHER): Payer: Medicare Other | Admitting: Nurse Practitioner

## 2021-10-02 ENCOUNTER — Ambulatory Visit (INDEPENDENT_AMBULATORY_CARE_PROVIDER_SITE_OTHER): Payer: Medicare Other | Admitting: Nurse Practitioner

## 2021-10-02 ENCOUNTER — Other Ambulatory Visit: Payer: Self-pay

## 2021-10-02 ENCOUNTER — Encounter (INDEPENDENT_AMBULATORY_CARE_PROVIDER_SITE_OTHER): Payer: Self-pay | Admitting: Nurse Practitioner

## 2021-10-02 VITALS — BP 165/72 | HR 63 | Resp 16 | Ht 63.0 in | Wt 231.0 lb

## 2021-10-02 DIAGNOSIS — M7989 Other specified soft tissue disorders: Secondary | ICD-10-CM | POA: Diagnosis not present

## 2021-10-02 DIAGNOSIS — M79661 Pain in right lower leg: Secondary | ICD-10-CM

## 2021-10-07 ENCOUNTER — Encounter (INDEPENDENT_AMBULATORY_CARE_PROVIDER_SITE_OTHER): Payer: Self-pay | Admitting: Nurse Practitioner

## 2021-10-07 NOTE — Progress Notes (Signed)
Subjective:    Patient ID: Angelin Dossie Der, female    DOB: 01/07/1956, 65 y.o.   MRN: [id number] Chief Complaint  Patient presents with   New Patient (Initial Visit)    Consult for right leg pain    The patient is seen for evaluation of painful lower extremities. Patient notes the pain is variable and not always associated with activity.  The pain is somewhat consistent day to day occurring on most days. The patient notes the pain also occurs with standing and routinely seems worse as the day wears on. The pain has been progressive over the past several years. The patient states these symptoms are causing  a profound negative impact on quality of life and daily activities.  The patient denies rest pain or dangling of an extremity off the side of the bed during the night for relief. No open wounds or sores at this time. No history of DVT or phlebitis. No prior interventions or surgeries.  There is also been a recent right fifth toe fracture   Review of Systems  Cardiovascular:  Positive for leg swelling.  Musculoskeletal:  Positive for gait problem.  All other systems reviewed and are negative.     Objective:   Physical Exam Vitals reviewed.  HENT:     Head: Normocephalic.  Cardiovascular:     Rate and Rhythm: Normal rate.     Pulses:          Dorsalis pedis pulses are 1+ on the right side and 1+ on the left side.       Posterior tibial pulses are 1+ on the right side and 1+ on the left side.  Pulmonary:     Effort: Pulmonary effort is normal.  Neurological:     Mental Status: She is alert and oriented to person, place, and time.  Psychiatric:        Mood and Affect: Mood normal.        Behavior: Behavior normal.        Thought Content: Thought content normal.        Judgment: Judgment normal.    BP (!) 165/72 (BP Location: Right Arm)   Pulse 63   Resp 16   Ht 5\' 3"  (1.6 m)   Wt 231 lb (104.8 kg)   BMI 40.92 kg/m   History reviewed. No pertinent past medical  history.  Social History   Socioeconomic History   Marital status: Married    Spouse name: Not on file   Number of children: Not on file   Years of education: Not on file   Highest education level: Not on file  Occupational History   Not on file  Tobacco Use   Smoking status: Never   Smokeless tobacco: Never  Substance and Sexual Activity   Alcohol use: No   Drug use: No   Sexual activity: Not on file  Other Topics Concern   Not on file  Social History Narrative   Not on file   Social Determinants of Health   Financial Resource Strain: Not on file  Food Insecurity: Not on file  Transportation Needs: Not on file  Physical Activity: Not on file  Stress: Not on file  Social Connections: Not on file  Intimate Partner Violence: Not on file    Past Surgical History:  Procedure Laterality Date   [place]  History reviewed. No pertinent family history.  Allergies  Allergen Reactions   Penicillins Hives    CBC Latest Ref Rng & Units 09/04/2015  WBC 3.6 - 11.0 K/uL 5.7  Hemoglobin 12.0 - 16.0 g/dL 43.1  Hematocrit 54.0 - 47.0 % 40.0  Platelets 150 - 440 K/uL 299      CMP     Component Value Date/Time   NA 141 09/04/2015 1959   K 3.7 09/04/2015 1959   CL 109 09/04/2015 1959   CO2 29 09/04/2015 1959   GLUCOSE 95 09/04/2015 1959   BUN 11 09/04/2015 1959   CREATININE 0.74 09/04/2015 1959   CALCIUM 8.8 (L) 09/04/2015 1959   PROT 6.6 09/04/2015 1959   ALBUMIN 3.9 09/04/2015 1959   AST 18 09/04/2015 1959   ALT 13 (L) 09/04/2015 1959   ALKPHOS 75 09/04/2015 1959   BILITOT 0.9 09/04/2015 1959   GFRNONAA >60 09/04/2015 1959   GFRAA >60 09/04/2015 1959     No results found.     Assessment & Plan:   1. Pain and swelling of right lower leg  Recommend:  The patient has atypical pain symptoms for pure atherosclerotic disease. However, on physical exam there is evidence of mixed venous  and arterial disease, given the diminished pulses and the edema associated with venous changes of the legs.  Noninvasive studies including ABI's and venous ultrasound of the legs will be obtained and the patient will follow up with me to review these studies.  I suspect the patient is c/o pseudoclaudication.  Patient should have an evaluation of his LS spine which I defer to the primary service.  The patient should continue walking and begin a more formal exercise program. The patient should continue his antiplatelet therapy and aggressive treatment of the lipid abnormalities.  The patient should begin wearing graduated compression socks 15-20 mmHg strength to control edema.      No current outpatient medications on file prior to visit.   No current facility-administered medications on file prior to visit.    There are no Patient Instructions on file for this visit. No follow-ups on file.   Georgiana Spinner, NP

## 2021-10-11 ENCOUNTER — Other Ambulatory Visit (INDEPENDENT_AMBULATORY_CARE_PROVIDER_SITE_OTHER): Payer: Self-pay | Admitting: Nurse Practitioner

## 2021-10-11 DIAGNOSIS — M79604 Pain in right leg: Secondary | ICD-10-CM

## 2021-10-11 DIAGNOSIS — R0989 Other specified symptoms and signs involving the circulatory and respiratory systems: Secondary | ICD-10-CM

## 2021-10-12 ENCOUNTER — Other Ambulatory Visit: Payer: Self-pay

## 2021-10-12 ENCOUNTER — Ambulatory Visit (INDEPENDENT_AMBULATORY_CARE_PROVIDER_SITE_OTHER): Payer: Medicare Other

## 2021-10-12 ENCOUNTER — Ambulatory Visit (INDEPENDENT_AMBULATORY_CARE_PROVIDER_SITE_OTHER): Payer: Medicare Other | Admitting: Nurse Practitioner

## 2021-10-12 VITALS — BP 127/75 | HR 103 | Ht 63.0 in | Wt 219.0 lb

## 2021-10-12 DIAGNOSIS — R0989 Other specified symptoms and signs involving the circulatory and respiratory systems: Secondary | ICD-10-CM

## 2021-10-12 DIAGNOSIS — I83811 Varicose veins of right lower extremities with pain: Secondary | ICD-10-CM

## 2021-10-12 DIAGNOSIS — M1812 Unilateral primary osteoarthritis of first carpometacarpal joint, left hand: Secondary | ICD-10-CM | POA: Diagnosis not present

## 2021-10-12 DIAGNOSIS — M7989 Other specified soft tissue disorders: Secondary | ICD-10-CM

## 2021-10-12 DIAGNOSIS — M79604 Pain in right leg: Secondary | ICD-10-CM

## 2021-10-13 ENCOUNTER — Encounter (INDEPENDENT_AMBULATORY_CARE_PROVIDER_SITE_OTHER): Payer: Self-pay | Admitting: Nurse Practitioner

## 2021-10-13 NOTE — Progress Notes (Signed)
Subjective:    Patient ID: April Manning, female    DOB: Mar 11, 1956, 65 y.o.   MRN: [id number] Chief Complaint  Patient presents with   Follow-up    Pt conv abi and ven ref    April Manning is a 64 year old female that returns for followup evaluation after the initial visit. The patient continues to have pain in the lower extremities with dependency. The pain is lessened with elevation. Graduated compression stockings, Class I (20-30 mmHg), have been worn but the stockings do not eliminate the leg pain. Over-the-counter analgesics do not improve the symptoms. The degree of discomfort continues to interfere with daily activities. The patient notes the pain in the legs is causing problems with daily exercise, at the workplace and even with household activities and maintenance such as standing in the kitchen preparing meals and doing dishes.   Venous ultrasound shows normal deep venous system, no evidence of acute or chronic DVT.  Superficial reflux is present in the right great saphenous vein from the proximal thigh extending to the proximal calf.  The left great saphenous vein also has reflux in the mid thigh.  That  The patient had an ABI of 1.36 on the right and 1.32 on the left.  She has triphasic tibial artery waveforms with good toe waveforms bilaterally.   Review of Systems  Cardiovascular:  Positive for leg swelling.  Musculoskeletal:  Positive for gait problem.  Skin:  Negative for wound.  All other systems reviewed and are negative.     Objective:   Physical Exam Vitals reviewed.  HENT:     Head: Normocephalic.  Cardiovascular:     Rate and Rhythm: Normal rate.     Pulses: Decreased pulses.  Pulmonary:     Effort: Pulmonary effort is normal.  Musculoskeletal:        General: Swelling present.  Skin:    General: Skin is warm and dry.  Neurological:     Mental Status: She is alert and oriented to person, place, and time.  Psychiatric:        Mood and Affect:  Mood normal.        Behavior: Behavior normal.        Thought Content: Thought content normal.        Judgment: Judgment normal.    BP 127/75   Pulse (!) 103   Ht 5\' 3"  (1.6 m)   Wt 219 lb (99.3 kg)   BMI 38.79 kg/m   History reviewed. No pertinent past medical history.  Social History   Socioeconomic History   Marital status: Married    Spouse name: Not on file   Number of children: Not on file   Years of education: Not on file   Highest education level: Not on file  Occupational History   Not on file  Tobacco Use   Smoking status: Never   Smokeless tobacco: Never  Substance and Sexual Activity   Alcohol use: No   Drug use: No   Sexual activity: Not on file  Other Topics Concern   Not on file  Social History Narrative   Not on file   Social Determinants of Health   Financial Resource Strain: Not on file  Food Insecurity: Not on file  Transportation Needs: Not on file  Physical Activity: Not on file  Stress: Not on file  Social Connections: Not on file  Intimate Partner Violence: Not on file    Past Surgical History:  Procedure Laterality Date  FOOT SURGERY     ROTATOR CUFF REPAIR     TENDON REPAIR     TONSILLECTOMY      History reviewed. No pertinent family history.  Allergies  Allergen Reactions   Penicillins Hives    CBC Latest Ref Rng & Units 09/04/2015  WBC 3.6 - 11.0 K/uL 5.7  Hemoglobin 12.0 - 16.0 g/dL 85.4  Hematocrit 62.7 - 47.0 % 40.0  Platelets 150 - 440 K/uL 299      CMP     Component Value Date/Time   NA 141 09/04/2015 1959   K 3.7 09/04/2015 1959   CL 109 09/04/2015 1959   CO2 29 09/04/2015 1959   GLUCOSE 95 09/04/2015 1959   BUN 11 09/04/2015 1959   CREATININE 0.74 09/04/2015 1959   CALCIUM 8.8 (L) 09/04/2015 1959   PROT 6.6 09/04/2015 1959   ALBUMIN 3.9 09/04/2015 1959   AST 18 09/04/2015 1959   ALT 13 (L) 09/04/2015 1959   ALKPHOS 75 09/04/2015 1959   BILITOT 0.9 09/04/2015 1959   GFRNONAA >60 09/04/2015 1959    GFRAA >60 09/04/2015 1959     No results found.     Assessment & Plan:   1. Varicose veins of right lower extremity with pain Recommend  I have reviewed my previous  discussion with the patient regarding  varicose veins and why they cause symptoms. Patient will continue  wearing graduated compression stockings class 1 on a daily basis, beginning first thing in the morning and removing them in the evening.    In addition, behavioral modification including elevation during the day was again discussed and this will continue.  The patient has utilized over the counter pain medications and has been exercising.  However, at this time conservative therapy has not alleviated the patient's symptoms of leg pain and swelling  Recommend: laser ablation of the right great saphenous veins to eliminate the symptoms of pain and swelling of the lower extremities caused by the severe superficial venous reflux disease.    2. Primary osteoarthritis of first carpometacarpal joint of left hand Continue NSAID medications as already ordered, these medications have been reviewed and there are no changes at this time.  Continued activity and therapy was stressed.    No current outpatient medications on file prior to visit.   No current facility-administered medications on file prior to visit.    There are no Patient Instructions on file for this visit. No follow-ups on file.   Georgiana Spinner, NP

## 2022-02-15 ENCOUNTER — Telehealth (INDEPENDENT_AMBULATORY_CARE_PROVIDER_SITE_OTHER): Payer: Self-pay

## 2022-02-15 NOTE — Telephone Encounter (Signed)
Xanax 0.5 mg #2 with no refills was left on pharmacy [facility] voicemail.Patient was made aware that medication will be called into pharmacy ?

## 2022-02-19 ENCOUNTER — Encounter (INDEPENDENT_AMBULATORY_CARE_PROVIDER_SITE_OTHER): Payer: Self-pay | Admitting: Vascular Surgery

## 2022-02-19 ENCOUNTER — Ambulatory Visit (INDEPENDENT_AMBULATORY_CARE_PROVIDER_SITE_OTHER): Payer: Medicare Other | Admitting: Vascular Surgery

## 2022-02-19 DIAGNOSIS — I83811 Varicose veins of right lower extremities with pain: Secondary | ICD-10-CM | POA: Diagnosis not present

## 2022-02-19 NOTE — Progress Notes (Signed)
?  April Manning is a 66 y.o. female who presents with symptomatic venous reflux ? ?No past medical history on file. ? ?Past Surgical History:  ?Procedure Laterality Date  ? FOOT SURGERY    ? ROTATOR CUFF REPAIR    ? TENDON REPAIR    ? TONSILLECTOMY    ? ? ?No current outpatient medications on file. ? ?Allergies  ?Allergen Reactions  ? Penicillins Hives  ? ? ? ?Varicose veins of leg with pain, right ?  ? ? ?PLAN: The patient's right lower extremity was sterilely prepped and draped. The ultrasound machine was used to visualize the saphenous vein throughout its course. A segment at the level of the knee where a large branch joined the saphenous vein was selected for access. The saphenous vein was accessed without difficulty using ultrasound guidance with a micropuncture needle. A 0.018 wire was then placed beyond the saphenofemoral junction and the needle was removed. The 65 cm sheath was then placed over the wire and the wire and dilator were removed. The laser fiber was then placed through the sheath and its tip was placed approximately 4-5 centimeters below the saphenofemoral junction. Tumescent anesthesia was then created with a dilute lidocaine solution. Laser energy was then delivered with constant withdrawal of the sheath and laser fiber. Approximately 1278 joules of energy were delivered over a length of 29 centimeters using a 1470 Hz VenaCure machine at 7 W. Sterile dressings were placed. The patient tolerated the procedure well without obvious complications.  ? ?Follow-up in 1 week with post-laser duplex.   ?

## 2022-02-26 ENCOUNTER — Ambulatory Visit (INDEPENDENT_AMBULATORY_CARE_PROVIDER_SITE_OTHER): Payer: Medicare Other

## 2022-02-26 DIAGNOSIS — I83811 Varicose veins of right lower extremities with pain: Secondary | ICD-10-CM | POA: Diagnosis not present

## 2022-03-07 ENCOUNTER — Other Ambulatory Visit: Payer: Self-pay

## 2022-03-07 ENCOUNTER — Emergency Department
Admission: EM | Admit: 2022-03-07 | Discharge: 2022-03-07 | Disposition: A | Discharge status: Home / Self Care | Payer: Medicare Other | Attending: Emergency Medicine | Admitting: Emergency Medicine

## 2022-03-07 DIAGNOSIS — I480 Paroxysmal atrial fibrillation: Secondary | ICD-10-CM | POA: Diagnosis not present

## 2022-03-07 DIAGNOSIS — R072 Precordial pain: Secondary | ICD-10-CM | POA: Diagnosis present

## 2022-03-07 LAB — COMPREHENSIVE METABOLIC PANEL
ALT: 13 U/L (ref 0–44)
AST: 18 U/L (ref 15–41)
Albumin: 3.8 g/dL (ref 3.5–5.0)
Alkaline Phosphatase: 87 U/L (ref 38–126)
Anion gap: 8 (ref 5–15)
BUN: 19 mg/dL (ref 8–23)
CO2: 27 mmol/L (ref 22–32)
Calcium: 9.6 mg/dL (ref 8.9–10.3)
Chloride: 104 mmol/L (ref 98–111)
Creatinine, Ser: 0.63 mg/dL (ref 0.44–1.00)
GFR, Estimated: >60 mL/min (ref >60)
Glucose, Bld: 110 mg/dL — ABNORMAL HIGH (ref 70–99)
Potassium: 4.2 mmol/L (ref 3.5–5.1)
Sodium: 139 mmol/L (ref 135–145)
Total Bilirubin: 0.8 mg/dL (ref 0.3–1.2)
Total Protein: 7.3 g/dL (ref 6.5–8.1)

## 2022-03-07 LAB — CBC WITH DIFFERENTIAL/PLATELET
Abs Immature Granulocytes: 0.03 10*3/uL (ref 0.00–0.07)
Basophils Absolute: 0.1 10*3/uL (ref 0.0–0.1)
Basophils Relative: 1 %
Eosinophils Absolute: 0.1 10*3/uL (ref 0.0–0.5)
Eosinophils Relative: 2 %
HCT: 44.2 % (ref 36.0–46.0)
Hemoglobin: 14.4 g/dL (ref 12.0–15.0)
Immature Granulocytes: 0 %
Lymphocytes Relative: 24 %
Lymphs Abs: 1.9 10*3/uL (ref 0.7–4.0)
MCH: 30.8 pg (ref 26.0–34.0)
MCHC: 32.6 g/dL (ref 30.0–36.0)
MCV: 94.4 fL (ref 80.0–100.0)
Monocytes Absolute: 0.7 10*3/uL (ref 0.1–1.0)
Monocytes Relative: 9 %
Neutro Abs: 5.1 10*3/uL (ref 1.7–7.7)
Neutrophils Relative %: 64 %
Platelets: 361 10*3/uL (ref 150–400)
RBC: 4.68 MIL/uL (ref 3.87–5.11)
RDW: 12.2 % (ref 11.5–15.5)
WBC: 8 10*3/uL (ref 4.0–10.5)
nRBC: 0 % (ref 0.0–0.2)

## 2022-03-07 LAB — TROPONIN I (HIGH SENSITIVITY): Troponin I (High Sensitivity): 5 ng/L (ref <18)

## 2022-03-07 LAB — BRAIN NATRIURETIC PEPTIDE: B Natriuretic Peptide: 48.1 pg/mL (ref 0.0–100.0)

## 2022-03-07 MED ORDER — DILTIAZEM HCL-DEXTROSE 125-5 MG/125ML-% IV SOLN (PREMIX): 5.0000 mg/h | INTRAVENOUS | Status: DC

## 2022-03-07 MED ORDER — DILTIAZEM HCL 25 MG/5ML IV SOLN
10.0000 mg | Freq: Once | INTRAVENOUS | Status: AC
  Administered 2022-03-07: 10 mg via INTRAVENOUS
  Filled 2022-03-07: qty 5

## 2022-03-07 MED ORDER — SODIUM CHLORIDE 0.9 % IV BOLUS
1000.0000 mL | Freq: Once | INTRAVENOUS | Status: AC
  Administered 2022-03-07: 1000 mL via INTRAVENOUS

## 2022-03-07 NOTE — ED Provider Notes (Signed)
? ?[facility] ?Provider Note ? ? Event Date/Time  ? First MD Initiated Contact with Patient 03/07/22 1231   ?  (approximate) ?History  ?Chest Pain ? ?HPI ?April Manning is a 66 y.o. female with no stated past medical history presents for chest discomfort that began last night and persisted throughout the day.  Patient states that she began having substernal chest discomfort that she does not describe as pain last night and when she awoke today with similar discomfort, she took her blood pressure and found it to be in the 0000000 systolic.  Patient then called EMS that found patient to be in atrial fibrillation upon their arrival.  Patient denies ever having any history of atrial fibrillation but does endorse family history.  Patient currently denies any vision changes, tinnitus, difficulty speaking, facial droop, sore throat, chest pain, shortness of breath, abdominal pain, nausea/vomiting/diarrhea, dysuria, or weakness/numbness/paresthesias in any extremity ?Physical Exam  ?Triage Vital Signs: ?ED Triage Vitals  ?Enc Vitals Group  ?   BP 03/07/22 1229 (!) 168/67  ?   Pulse Rate 03/07/22 1229 93  ?   Resp 03/07/22 1229 17  ?   Temp 03/07/22 1229 98.7 ?F (37.1 ?C)  ?   Temp src --   ?   SpO2 03/07/22 1226 94 %  ?   Weight 03/07/22 1233 236 lb 12.8 oz (107.4 kg)  ?   Height 03/07/22 1233 5\' 3"  (1.6 m)  ?   Head Circumference --   ?   Peak Flow --   ?   Pain Score 03/07/22 1232 0  ?   Pain Loc --   ?   Pain Edu? --   ?   Excl. in [place]? --   ? ?Most recent vital signs: ?Vitals:  ? 03/07/22 1430 03/07/22 1500  ?BP: 122/70 130/61  ?Pulse: 80 60  ?Resp: (!) 27 17  ?Temp:    ?SpO2: 96% 97%  ? ?General: Awake, oriented x4. ?CV:  Good peripheral perfusion.  ?Resp:  Normal effort.  ?Abd:  No distention.  ?Other:  Elderly overweight Caucasian female laying in bed in no distress ?ED Results / Procedures / Treatments  ?Labs ?(all labs ordered are listed, but only abnormal results are displayed) ?Labs  Reviewed  ?COMPREHENSIVE METABOLIC PANEL - Abnormal; Notable for the following components:  ?    Result Value  ? Glucose, Bld 110 (*)   ? All other components within normal limits  ?CBC WITH DIFFERENTIAL/PLATELET  ?BRAIN NATRIURETIC PEPTIDE  ?TROPONIN I (HIGH SENSITIVITY)  ?TROPONIN I (HIGH SENSITIVITY)  ? ?EKG ?ED ECG REPORT ?I, Naaman Plummer, the attending physician, personally viewed and interpreted this ECG. ?Date: 03/07/2022 ?EKG Time: 1231 ?Rate: 143 ?Rhythm: Atrial fibrillation ?QRS Axis: normal ?Intervals: normal ?ST/T Wave abnormalities: normal ?Narrative Interpretation: Atrial fibrillation.  No evidence of acute ischemia ? ?PROCEDURES: ?Critical Care performed: Yes, see critical care procedure note(s) ?.1-3 Lead EKG Interpretation ?Performed by: Naaman Plummer, MD ?Authorized by: Naaman Plummer, MD  ? ?  Interpretation: normal   ?  ECG rate:  67 ?  ECG rate assessment: normal   ?  Rhythm: sinus rhythm   ?  Ectopy: none   ?  Conduction: normal   ?MEDICATIONS ORDERED IN ED: ?Medications  ?sodium chloride 0.9 % bolus 1,000 mL (0 mLs Intravenous Stopped 03/07/22 1524)  ?diltiazem (CARDIZEM) injection 10 mg (10 mg Intravenous Given 03/07/22 1346)  ? ?IMPRESSION / MDM / ASSESSMENT AND PLAN / ED COURSE  ?  I reviewed the triage vital signs and the nursing notes. ?             ?               ?+ atrial fibrillation w/ RVR ?DDx: Pneumothorax, Pneumonia, Pulmonary Embolus, Tamponade, ACS, Thyrotoxicosis.  ?No history or evidence decompensated heart failure. ?Given their history and exam it is likely this patient is unlikely to spontaneously revert to a rate controlled rhythm and necessitates a thorough workup for their arrhythmia. ?Workup: ECG, CXR, CBC, BMP, UA, Troponin, BNP, TSH, Ca-Mag-Phos ?Interventions: Defer Cardioversion (uncertain historical reliability with time of onset, increased risk of thromboembolic stroke).  Start diltiazem bolus  ?Reassessment: Patient maintained NSR during multi-hour observation in  ED. ?Disposition: Discharge home with prompt PCP follow up and cardiology referral. ?  ?FINAL CLINICAL IMPRESSION(S) / ED DIAGNOSES  ? ?Final diagnoses:  ?Paroxysmal atrial fibrillation ([place])  ? ?Rx / DC Orders  ? ?ED Discharge Orders   ? ?      Ordered  ?  Ambulatory referral to Cardiology       ?Comments: If you have not heard from the Cardiology office within the next 72 hours please call [phone].  ? 03/07/22 1531  ? ?  ?  ? ?  ? ?Note:  This document was prepared using Dragon voice recognition software and may include unintentional dictation errors. ?  ?Naaman Plummer, MD ?03/07/22 1614 ? ?

## 2022-03-07 NOTE — ED Notes (Signed)
Pt gives verbal consent to dc  

## 2022-03-07 NOTE — ED Triage Notes (Signed)
Pt began having chest discomfort last night before she went to bed. Pt woke up and it was more persistent every time she drank something. When EMS arrived pt was in Afib. Pt states she does not have a heart hx.  ?

## 2022-03-26 ENCOUNTER — Ambulatory Visit (INDEPENDENT_AMBULATORY_CARE_PROVIDER_SITE_OTHER): Payer: Medicare Other | Admitting: Vascular Surgery

## 2022-03-26 ENCOUNTER — Encounter (INDEPENDENT_AMBULATORY_CARE_PROVIDER_SITE_OTHER): Payer: Self-pay | Admitting: Vascular Surgery

## 2022-03-26 VITALS — BP 170/68 | HR 67 | Resp 16 | Ht 63.0 in | Wt 227.0 lb

## 2022-03-26 DIAGNOSIS — I83811 Varicose veins of right lower extremities with pain: Secondary | ICD-10-CM

## 2022-03-26 NOTE — Assessment & Plan Note (Signed)
Recommend:  The patient has had successful ablation of the previously incompetent saphenous venous system but still has persistent symptoms of pain and swelling that are having a negative impact on daily life and daily activities.  Patient should undergo injection sclerotherapy to treat the residual varicosities.  In her case, this will likely be a combination of foam sclerotherapy and saline sclerotherapy  The risks, benefits and alternative therapies were reviewed in detail with the patient.  All questions were answered.  The patient agrees to proceed with sclerotherapy at their convenience.  The patient will continue wearing the graduated compression stockings and using the over-the-counter pain medications to treat her symptoms.

## 2022-03-26 NOTE — Progress Notes (Signed)
MRN : [id number]  April Manning is a 66 y.o. (1956/06/18) female who presents with chief complaint of No chief complaint on file. Marland Kitchen  History of Present Illness: Patient returns today in follow up of venous insufficiency.  She underwent laser ablation of the right great saphenous vein about a month ago.  She is doing well.  She does have painful residual varicosities in the right lower leg track anterior and laterally below the ablation site and correlate with prominent varicosity seen on exam.  She denies any fevers or chills.  Her postprocedural duplex showed successful ablation without DVT  No current outpatient medications on file.   No current facility-administered medications for this visit.    No past medical history on file.  Past Surgical History:  Procedure Laterality Date   FOOT SURGERY     ROTATOR CUFF REPAIR     TENDON REPAIR     TONSILLECTOMY       Social History   Tobacco Use   Smoking status: Never   Smokeless tobacco: Never  Vaping Use   Vaping Use: Never used  Substance Use Topics   Alcohol use: No   Drug use: No      Family History Mother with venous insufficiency  Allergies  Allergen Reactions   Penicillins Hives     REVIEW OF SYSTEMS (Negative unless checked)  Constitutional: [] Weight loss  [] Fever  [] Chills Cardiac: [] Chest pain   [] Chest pressure   [] Palpitations   [] Shortness of breath when laying flat   [] Shortness of breath at rest   [] Shortness of breath with exertion. Vascular:  [] Pain in legs with walking   [] Pain in legs at rest   [] Pain in legs when laying flat   [] Claudication   [] Pain in feet when walking  [] Pain in feet at rest  [] Pain in feet when laying flat   [] History of DVT   [] Phlebitis   [x] Swelling in legs   [x] Varicose veins   [] Non-healing ulcers Pulmonary:   [] Uses home oxygen   [] Productive cough   [] Hemoptysis   [] Wheeze  [] COPD   [] Asthma Neurologic:  [] Dizziness  [] Blackouts   [] Seizures   [] History of  stroke   [] History of TIA  [] Aphasia   [] Temporary blindness   [] Dysphagia   [] Weakness or numbness in arms   [] Weakness or numbness in legs Musculoskeletal:  [x] Arthritis   [] Joint swelling   [] Joint pain   [] Low back pain Hematologic:  [] Easy bruising  [] Easy bleeding   [] Hypercoagulable state   [] Anemic   Gastrointestinal:  [] Blood in stool   [] Vomiting blood  [x] Gastroesophageal reflux/heartburn   [] Abdominal pain Genitourinary:  [] Chronic kidney disease   [] Difficult urination  [] Frequent urination  [] Burning with urination   [] Hematuria Skin:  [] Rashes   [] Ulcers   [] Wounds Psychological:  [] History of anxiety   []  History of major depression.  Physical Examination  BP (!) 170/68 (BP Location: Right Arm)   Pulse 67   Resp 16   Ht 5\' 3"  (1.6 m)   Wt 227 lb (103 kg)   BMI 40.21 kg/m  Gen:  WD/WN, NAD.  Obese Head: [place]/AT, No temporalis wasting. Ear/Nose/Throat: Hearing grossly intact, nares w/o erythema or drainage Eyes: Conjunctiva clear. Sclera non-icteric Neck: Supple.  Trachea midline Pulmonary:  Good air movement, no use of accessory muscles.  Cardiac: RRR, no JVD Vascular:  Vessel Right Left  Radial Palpable Palpable               Musculoskeletal:  M/S 5/5 throughout.  No deformity or atrophy.  Diffuse residual varicosities measuring up to 2 mm in the right lower leg.  Trace to 1+ right lower extremity edema. Neurologic: Sensation grossly intact in extremities.  Symmetrical.  Speech is fluent.  Psychiatric: Judgment intact, Mood & affect appropriate for pt's clinical situation. Dermatologic: No rashes or ulcers noted.  No cellulitis or open wounds.      Labs Recent Results (from the past 2160 hour(s))  Comprehensive metabolic panel     Status: Abnormal   Collection Time: 03/07/22 12:38 PM  Result Value Ref Range   Sodium 139 135 - 145 mmol/L   Potassium 4.2 3.5 - 5.1 mmol/L   Chloride 104 98 - 111 mmol/L   CO2 27 22 - 32 mmol/L   Glucose, Bld 110 (H) 70 - 99  mg/dL    Comment: Glucose reference range applies only to samples taken after fasting for at least 8 hours.   BUN 19 8 - 23 mg/dL   Creatinine, Ser 0.63 0.44 - 1.00 mg/dL   Calcium 9.6 8.9 - 10.3 mg/dL   Total Protein 7.3 6.5 - 8.1 g/dL   Albumin 3.8 3.5 - 5.0 g/dL   AST 18 15 - 41 U/L   ALT 13 0 - 44 U/L   Alkaline Phosphatase 87 38 - 126 U/L   Total Bilirubin 0.8 0.3 - 1.2 mg/dL   GFR, Estimated >60 >60 mL/min    Comment: (NOTE) Calculated using the CKD-EPI Creatinine Equation (2021)    Anion gap 8 5 - 15    Comment: Performed at [facility], [address] [place], [place] [address]  CBC with Differential     Status: None   Collection Time: 03/07/22 12:38 PM  Result Value Ref Range   WBC 8.0 4.0 - 10.5 K/uL   RBC 4.68 3.87 - 5.11 MIL/uL   Hemoglobin 14.4 12.0 - 15.0 g/dL   HCT 44.2 36.0 - 46.0 %   MCV 94.4 80.0 - 100.0 fL   MCH 30.8 26.0 - 34.0 pg   MCHC 32.6 30.0 - 36.0 g/dL   RDW 12.2 11.5 - 15.5 %   Platelets 361 150 - 400 K/uL   nRBC 0.0 0.0 - 0.2 %   Neutrophils Relative % 64 %   Neutro Abs 5.1 1.7 - 7.7 K/uL   Lymphocytes Relative 24 %   Lymphs Abs 1.9 0.7 - 4.0 K/uL   Monocytes Relative 9 %   Monocytes Absolute 0.7 0.1 - 1.0 K/uL   Eosinophils Relative 2 %   Eosinophils Absolute 0.1 0.0 - 0.5 K/uL   Basophils Relative 1 %   Basophils Absolute 0.1 0.0 - 0.1 K/uL   Immature Granulocytes 0 %   Abs Immature Granulocytes 0.03 0.00 - 0.07 K/uL    Comment: Performed at [facility], [place], [place] [address]  Troponin I (High Sensitivity)     Status: None   Collection Time: 03/07/22 12:38 PM  Result Value Ref Range   Troponin I (High Sensitivity) 5 <18 ng/L    Comment: (NOTE) Elevated high sensitivity troponin I (hsTnI) values and significant  changes across serial measurements may suggest ACS but many other  chronic and acute conditions are known to elevate hsTnI results.  Refer to the "Links" section for chest pain  algorithms and additional  guidance. Performed at [facility], [address], [place], [place] [address]   Brain natriuretic peptide     Status: None  Collection Time: 03/07/22 12:54 PM  Result Value Ref Range   B Natriuretic Peptide 48.1 0.0 - 100.0 pg/mL    Comment: Performed at [facility], [address] [place], [place] [address]    Radiology VAS [place] LASER ABLATION OF SUPERFICIAL VEIN  Result Date: 03/01/2022  Lower Venous Study Patient Name:  LAKISKA FEIMSTER  Date of Exam:   02/26/2022 Medical Rec #: [id number]           Accession #:    [id number] Date of Birth: 12/23/1955          Patient Gender: F Patient Age:   3 years Exam Location:  [facility] Procedure:      VAS [place] LASER ABLATION OF SUPERFICIAL VEIN Referring Phys: Leotis Pain --------------------------------------------------------------------------------  Indications: Right post ablation.  Performing Technologist: Concha Norway RVT  Examination Guidelines: A complete evaluation includes B-mode imaging, spectral Doppler, color Doppler, and power Doppler as needed of all accessible portions of each vessel. Bilateral testing is considered an integral part of a complete examination. Limited examinations for reoccurring indications may be performed as noted.  +------+---------------+---------+-----------+----------+--------------+ RIGHT CompressibilityPhasicitySpontaneityPropertiesThrombus Aging +------+---------------+---------+-----------+----------+--------------+ CFV   Full           Yes      Yes                                 +------+---------------+---------+-----------+----------+--------------+ SFJ   Full           Yes      Yes                                 +------+---------------+---------+-----------+----------+--------------+ FV MidFull           Yes      Yes                                 +------+---------------+---------+-----------+----------+--------------+  POP   Full           Yes      Yes                                 +------+---------------+---------+-----------+----------+--------------+     Summary: Right: - There is no evidence of deep vein thrombosis in the lower extremity. However, portions of this examination were limited- see technologist comments above. GSV appears closed post ablation at the sfj distally.  *See table(s) above for measurements and observations. Electronically signed by Leotis Pain MD on 03/01/2022 at 12:03:50 PM.    Final     Assessment/Plan  Varicose veins of leg with pain, right Recommend:  The patient has had successful ablation of the previously incompetent saphenous venous system but still has persistent symptoms of pain and swelling that are having a negative impact on daily life and daily activities.  Patient should undergo injection sclerotherapy to treat the residual varicosities.  In her case, this will likely be a combination of foam sclerotherapy and saline sclerotherapy  The risks, benefits and alternative therapies were reviewed in detail with the patient.  All questions were answered.  The patient agrees to proceed with sclerotherapy at their convenience.  The patient will continue wearing the graduated compression stockings and using the over-the-counter pain medications to treat her  symptoms.        Leotis Pain, MD  03/26/2022 11:09 AM    This note was created with Dragon medical transcription system.  Any errors from dictation are purely unintentional

## 2022-03-26 NOTE — Patient Instructions (Signed)
Sclerotherapy Sclerotherapy is a procedure that is done to improve the appearance of varicose veins and spider veins. It also helps to relieve aching, swelling, cramping, and pain in the legs. Varicose veins are veins that have become enlarged, bulging, and twisted due to a damaged valve that causes blood to collect (pool) in the veins. Spider veins are small varicose veins. Sclerotherapy is usually performed on the legs because that is where varicose and spider veins most often occur. Sclerotherapy usually works best for smaller spider and varicose veins. This procedure involves injecting a chemical directly into the lining of the vein, causing it to swell and stick together. Over time, the vessel turns into scar tissue that fades from view. You may need more than one treatment to close a vein all the way. The number of veins injected in one session depends on the size and location of the veins, and on your overall medical condition. Tell a health care provider about: Any allergies you have. All medicines you are taking, including vitamins, herbs, eye drops, creams, and over-the-counter medicines. Any bleeding problems you have. Any surgeries you have had. Any medical conditions you have. Whether you are pregnant or may be pregnant. What are the risks? Generally, this is a safe procedure. However, problems may occur, including: Infection. Bleeding or blood clots. Allergic reactions to medicines or to the chemicals injected, which are called sclerosing agents. Larger injected veins becoming lumpy or hard. This may last for several months before getting better. Formation of small sores (ulcers) at the injection site. Red streaking in the groin area or bruising around the injection sites. Brown lines or spots at the injection sites. These usually disappear within 3 to 6 months, but in rare cases they can be permanent. What happens before the procedure? Medicines Ask your health care provider  about: Changing or stopping your regular medicines. This is especially important if you are taking diabetes medicines or blood thinners. Taking medicines such as aspirin and ibuprofen. These medicines can thin your blood. Do not take these medicines unless your health care provider tells you to take them. Taking over-the-counter medicines, vitamins, herbs, and supplements. Tests You may have an ultrasound of the affected area to check for blood clots and to check blood flow. In rare cases, you may have an X-ray procedure to check how blood flows through your veins (angiogram). For an angiogram, a dye is injected to outline your veins on X-rays. General instructions Do not use lotions or creams on your legs before the procedure unless your health care provider approves. Follow instructions from your health care provider about eating and drinking restrictions. Do not use any products that contain nicotine or tobacco before the procedure. These products include cigarettes, chewing tobacco, and vaping devices, such as e-cigarettes. If you need help quitting, ask your health care provider. Ask your health care provider what steps will be taken to help prevent infection. These steps may include: Removing hair at the injection sites. Washing skin with a germ-killing soap. What happens during the procedure?  The treatment area will be cleansed. A small, thin needle will be used to inject a chemical (sclerosant) into your varicose or spider veins. The sclerosant will irritate the lining of the vein and cause the vein to close below the injection site. You may feel some stinging, burning, or irritation. The injection may be repeated for more than one varicose or spider vein. After the procedure, the injection area will be wrapped with elastic bandages. The procedure   may vary among health care providers and hospitals. What can I expect after the procedure? After the procedure, it is common to  have: Swelling. Bruising and soreness. Mild skin discoloration. Slight bleeding from an injection site. Your injection area will be wrapped with elastic bandages. If there is bleeding, the bandages may be changed. After the treatment, you will be able to drive yourself home. You will need to wear compression stockings for about a week, or as long as your health care provider recommends. Follow these instructions at home: Injection area care Do not take baths, swim, or use a hot tub until your health care provider approves. Ask your health care provider if you may take showers. Wash the injection sites with a mild soap and lukewarm water. Do not apply hot compresses or any form of heat to the injected areas. Avoid direct exposure to sunlight, including suntanning and tanning beds. Activity Do light exercise every day, as told by your health care provider. Walking or riding a stationary bike may be good options for you. Return to your normal activities as told by your health care provider. Ask your health care provider what activities are safe for you. General instructions Take over-the-counter and prescription medicines only as told by your health care provider. Do not use any products that contain nicotine or tobacco. These products include cigarettes, chewing tobacco, and vaping devices, such as e-cigarettes. These can delay healing after the procedure. If you need help quitting, ask your health care provider. Do not use lotions or creams on your legs unless your health care provider approves. Wear compression stockings as told by your health care provider. These stockings help to prevent blood clots and reduce swelling in your legs. Wear loose-fitting clothing on the treatment area. Keep all follow-up visits. This is important. Contact a health care provider if: You have more redness, swelling, or pain around an injection site. You have more fluid or blood coming from an injection  site. An injection area feels warm to the touch. You have pus or a bad smell coming from an injection site. You have a fever. Get help right away if: You have leg pain that gets worse when you walk. You have redness or swelling in your leg that is getting worse. You have trouble breathing. You cough up blood. You have severe pain. Summary Sclerotherapy is a procedure that is done to improve the appearance of varicose veins and spider veins and to help relieve aching, swelling, cramping, and pain in the legs. A small, thin needle is used to inject a chemical (sclerosant) into a spider vein or varicose vein to close it off. Elastic bandages will be wrapped around the injection area after the procedure. Wear compression stockings as told by your health care provider. These stockings help to prevent blood clots and reduce swelling in your legs. This information is not intended to replace advice given to you by your health care provider. Make sure you discuss any questions you have with your health care provider. Document Revised: 03/29/2021 Document Reviewed: 03/29/2021 Elsevier Patient Education  2023 [employer] Inc[employer]  

## 2022-03-27 ENCOUNTER — Ambulatory Visit: Payer: Medicare Other | Admitting: Cardiology

## 2022-03-27 ENCOUNTER — Encounter: Payer: Self-pay | Admitting: Cardiology

## 2022-03-27 VITALS — BP 148/81 | HR 70 | Ht 60.0 in | Wt 227.0 lb

## 2022-03-27 DIAGNOSIS — I10 Essential (primary) hypertension: Secondary | ICD-10-CM | POA: Diagnosis not present

## 2022-03-27 DIAGNOSIS — G4730 Sleep apnea, unspecified: Secondary | ICD-10-CM | POA: Diagnosis not present

## 2022-03-27 DIAGNOSIS — I480 Paroxysmal atrial fibrillation: Secondary | ICD-10-CM

## 2022-03-27 NOTE — Patient Instructions (Signed)
Medications: ?Your physician recommends that you continue on your current medications as directed. Please refer to the Current Medication list given to you today. ?*If you need a refill on your cardiac medications before your next appointment, please call your pharmacy* ? ?Lab Work: ?None. ?If you have labs (blood work) drawn today and your tests are completely normal, you will receive your results only by: ?MyChart Message (if you have MyChart) OR ?A paper copy in the mail ?If you have any lab test that is abnormal or we need to change your treatment, we will call you to review the results. ? ?Testing/Procedures: ?None. ? ?Follow-Up: ?At [facility], you and your health needs are our priority.  As part of our continuing mission to provide you with exceptional heart care, we have created designated Provider Care Teams.  These Care Teams include your primary Cardiologist (physician) and Advanced Practice Providers (APPs -  Physician Assistants and Nurse Practitioners) who all work together to provide you with the care you need, when you need it. ? ?Your physician wants you to follow-up in: As needed with Dr. Lambert.  ? ?We recommend signing up for the patient portal called "MyChart".  Sign up information is provided on this After Visit Summary.  MyChart is used to connect with patients for Virtual Visits (Telemedicine).  Patients are able to view lab/test results, encounter notes, upcoming appointments, etc.  Non-urgent messages can be sent to your provider as well.   ?To learn more about what you can do with MyChart, go to [email or web address].   ? ?Any Other Special Instructions Will Be Listed Below (If Applicable).  ?

## 2022-03-27 NOTE — Progress Notes (Signed)
Electrophysiology Office Note:    Date:  03/27/2022   ID:  Julio Nyella, Matthai 07/04/1956, MRN [id number]  PCP:  Lavera Guise, MD  [facility] Cardiologist:  None  [facility] Electrophysiologist:  Vickie Epley, MD   Referring MD: Naaman Plummer, MD   Chief Complaint: Atrial fibrillation  History of Present Illness:    April Manning is a 66 y.o. female who presents for an evaluation of atrial fibrillation at the request of Dr. Cheri Fowler. Their medical history includes obesity, varicose veins.  The patient presented to the emergency department Mar 07, 2022 with substernal chest discomfort.  In the emergency department she was found to be in new diagnosis atrial fibrillation.  Heart rate was initially 143 bpm.  This was treated with calcium channel blockers intravenously which converted her back to normal rhythm.  She was discharged with close follow-up.  The patient tells me today that she does not go to doctors.  She is interested in natural remedies for her atrial fibrillation.  Her mother had atrial fibrillation. She was symptomatic while in atrial fibrillation.  She felt chest discomfort especially while eating or drinking.  This sensation is better now that she is back in normal rhythm.      No past medical history on file.  Past Surgical History:  Procedure Laterality Date   FOOT SURGERY     ROTATOR CUFF REPAIR     TENDON REPAIR     TONSILLECTOMY      Current Medications: No outpatient medications have been marked as taking for the 03/27/22 encounter (Office Visit) with Vickie Epley, MD.     Allergies:   Penicillins   Social History   Socioeconomic History   Marital status: Married    Spouse name: Not on file   Number of children: Not on file   Years of education: Not on file   Highest education level: Not on file  Occupational History   Not on file  Tobacco Use   Smoking status: Never   Smokeless tobacco: Never  Vaping Use   Vaping  Use: Never used  Substance and Sexual Activity   Alcohol use: No   Drug use: No   Sexual activity: Not on file  Other Topics Concern   Not on file  Social History Narrative   Not on file   Social Determinants of Health   Financial Resource Strain: Not on file  Food Insecurity: Not on file  Transportation Needs: Not on file  Physical Activity: Not on file  Stress: Not on file  Social Connections: Not on file     Family History: The patient's family history is not on file.  ROS:   Please see the history of present illness.    All other systems reviewed and are negative.  EKGs/Labs/Other Studies Reviewed:    The following studies were reviewed today:  Mar 07, 2022 EKG shows atrial fibrillation with rapid ventricular rate  EKG:  The ekg ordered today demonstrates sinus rhythm.   Recent Labs: 03/07/2022: ALT 13; B Natriuretic Peptide 48.1; BUN 19; Creatinine, Ser 0.63; Hemoglobin 14.4; Platelets 361; Potassium 4.2; Sodium 139  Recent Lipid Panel No results found for: CHOL, TRIG, HDL, CHOLHDL, VLDL, LDLCALC, LDLDIRECT  Physical Exam:    VS:  BP (!) 148/81   Pulse 70   Ht 5' (1.524 m)   Wt 227 lb (103 kg)   SpO2 97%   BMI 44.33 kg/m     Wt Readings from  Last 3 Encounters:  03/27/22 227 lb (103 kg)  03/26/22 227 lb (103 kg)  03/07/22 236 lb 12.8 oz (107.4 kg)     GEN:  Well nourished, well developed in no acute distress.  Obese HEENT: Normal NECK: No JVD; No carotid bruits LYMPHATICS: No lymphadenopathy CARDIAC: RRR, no murmurs, rubs, gallops RESPIRATORY:  Clear to auscultation without rales, wheezing or rhonchi  ABDOMEN: Soft, non-tender, non-distended MUSCULOSKELETAL:  No edema; No deformity  SKIN: Warm and dry NEUROLOGIC:  Alert and oriented x 3 PSYCHIATRIC:  Normal affect       ASSESSMENT:    1. Paroxysmal atrial fibrillation (HCC)   2. Primary hypertension   3. Sleep-disordered breathing   4. Morbid obesity ([place])    PLAN:    In order of  problems listed above:  #Paroxysmal atrial fibrillation The patient has a diagnosis of paroxysmal atrial fibrillation.  We spent the majority of today's visit talking about the associated stroke risk with atrial fibrillation.  We discussed the pathophysiology of atrial fibrillation.  We talked about the natural progression of atrial fibrillation from paroxysmal to persistent and ultimately a permanent.  We discussed the stroke risk factors including hypertension and atrial fibrillation (both of which she has).  She is very hesitant to consider any sort of medical therapy for her atrial fibrillation or hypertension.    I gave her some information about atrial fibrillation and gave her the names of blood thinners to think about.  I recommended starting Eliquis but the patient declines.  I have asked her to reach out to [place] should she change her mind and be willing to take an anticoagulant for stroke risk mitigation.  CHA2DS2-VASc Score = 3  The patient's score is based upon: CHF History: 0 HTN History: 0 Diabetes History: 0 Stroke History: 0 Vascular Disease History: 1 Age Score: 1 Gender Score: 1  #Hypertension Not controlled.  Not on any medications.  She is resistant to starting any medicines.  I encouraged her to follow-up with her primary care physician for further discussions about her blood pressures.  #Obesity We discussed her weight during today's visit.  She wanted to know what weight would be considered "normal" for someone her height.  I have encouraged her to focus on losing a small amount of weight first (5 to 10 pounds).  We discussed how this could have an impact on her blood pressures, atrial arrhythmias and overall quality of life.  Follow-up based on patient preference.  I have asked her to call us immediately should she decide to start an anticoagulant.       Medication Adjustments/Labs and Tests Ordered: Current medicines are reviewed at length with the patient today.   Concerns regarding medicines are outlined above.  Orders Placed This Encounter  Procedures   EKG 12-Lead   No orders of the defined types were placed in this encounter.    Signed, Hilton Cork. Quentin Ore, MD, [facility], [facility] 03/27/2022 2:20 PM    Electrophysiology [facility]

## 2022-04-09 ENCOUNTER — Telehealth (INDEPENDENT_AMBULATORY_CARE_PROVIDER_SITE_OTHER): Payer: Self-pay | Admitting: Vascular Surgery

## 2022-04-09 NOTE — Telephone Encounter (Signed)
LVM for pt TCB and schedule sclero appts.  She will need a FOAM sclero 1st with jd  4 weeks later, start SALINE sclero with jd/fb X 3   4 weeks after the last SALINE sclero, schedule follow up with jd/fb.   No auth required.

## 2022-05-21 ENCOUNTER — Ambulatory Visit (INDEPENDENT_AMBULATORY_CARE_PROVIDER_SITE_OTHER): Payer: Medicare Other | Admitting: Vascular Surgery

## 2022-05-21 ENCOUNTER — Encounter (INDEPENDENT_AMBULATORY_CARE_PROVIDER_SITE_OTHER): Payer: Self-pay | Admitting: Vascular Surgery

## 2022-05-21 VITALS — BP 161/78 | HR 77 | Resp 16 | Ht 63.0 in | Wt 225.0 lb

## 2022-05-21 DIAGNOSIS — I83811 Varicose veins of right lower extremities with pain: Secondary | ICD-10-CM | POA: Diagnosis not present

## 2022-05-21 NOTE — Progress Notes (Signed)
April Manning is a 66 y.o.female who presents with painful varicose veins of the right leg  No past medical history on file.  Past Surgical History:  Procedure Laterality Date   FOOT SURGERY     ROTATOR CUFF REPAIR     TENDON REPAIR     TONSILLECTOMY      No current outpatient medications on file.   No current facility-administered medications for this visit.    Allergies  Allergen Reactions   Penicillins Hives    Indication: Patient presents with symptomatic varicose veins of the right lower extremity.  Procedure: Foam sclerotherapy was performed on the right lower extremity. Using ultrasound guidance, 5 mL of foam Sotradecol was used to inject the varicosities of the right lower extremity. Compression wraps were placed. The patient tolerated the procedure well.

## 2022-06-18 ENCOUNTER — Encounter (INDEPENDENT_AMBULATORY_CARE_PROVIDER_SITE_OTHER): Payer: Self-pay | Admitting: Vascular Surgery

## 2022-06-18 ENCOUNTER — Ambulatory Visit (INDEPENDENT_AMBULATORY_CARE_PROVIDER_SITE_OTHER): Payer: Medicare Other | Admitting: Vascular Surgery

## 2022-06-18 VITALS — BP 157/80 | HR 61 | Resp 18 | Ht 63.0 in | Wt 224.0 lb

## 2022-06-18 DIAGNOSIS — I83811 Varicose veins of right lower extremities with pain: Secondary | ICD-10-CM

## 2022-06-18 NOTE — Progress Notes (Signed)
April Manning is a 66 y.o.female who presents with painful varicose veins of the right leg  No past medical history on file.  Past Surgical History:  Procedure Laterality Date   FOOT SURGERY     ROTATOR CUFF REPAIR     TENDON REPAIR     TONSILLECTOMY      No current outpatient medications on file.   No current facility-administered medications for this visit.    Allergies  Allergen Reactions   Penicillins Hives    Indication: Patient presents with symptomatic varicose veins of the right lower extremity.  Procedure: Foam sclerotherapy was performed on the right lower extremity. Using ultrasound guidance, 5 mL of foam Sotradecol was used to inject the varicosities of the right lower extremity. Compression wraps were placed. The patient tolerated the procedure well.

## 2022-07-16 ENCOUNTER — Encounter (INDEPENDENT_AMBULATORY_CARE_PROVIDER_SITE_OTHER): Payer: Self-pay | Admitting: Nurse Practitioner

## 2022-07-16 ENCOUNTER — Ambulatory Visit (INDEPENDENT_AMBULATORY_CARE_PROVIDER_SITE_OTHER): Payer: Medicare Other | Admitting: Nurse Practitioner

## 2022-07-16 VITALS — BP 152/73 | HR 62 | Resp 16 | Wt 220.8 lb

## 2022-07-16 DIAGNOSIS — I83811 Varicose veins of right lower extremities with pain: Secondary | ICD-10-CM

## 2022-07-16 NOTE — Progress Notes (Signed)
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   Procedure: Sclerotherapy using hypertonic saline mixed with 1% Lidocaine was performed on the right lower extremity. Compression wraps were placed. The patient tolerated the procedure well. 

## 2022-08-13 ENCOUNTER — Ambulatory Visit (INDEPENDENT_AMBULATORY_CARE_PROVIDER_SITE_OTHER): Payer: Medicare Other | Admitting: Nurse Practitioner

## 2022-08-13 ENCOUNTER — Encounter (INDEPENDENT_AMBULATORY_CARE_PROVIDER_SITE_OTHER): Payer: Self-pay | Admitting: Nurse Practitioner

## 2022-08-13 VITALS — BP 162/71 | HR 61 | Resp 18 | Ht 63.5 in | Wt 224.0 lb

## 2022-08-13 DIAGNOSIS — I83811 Varicose veins of right lower extremities with pain: Secondary | ICD-10-CM

## 2022-08-14 ENCOUNTER — Encounter (INDEPENDENT_AMBULATORY_CARE_PROVIDER_SITE_OTHER): Payer: Self-pay | Admitting: Nurse Practitioner

## 2022-08-14 NOTE — Progress Notes (Signed)
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   Procedure: Sclerotherapy using hypertonic saline mixed with 1% Lidocaine was performed on the right lower extremity. Compression wraps were placed. The patient tolerated the procedure well. 

## 2022-09-05 ENCOUNTER — Ambulatory Visit (INDEPENDENT_AMBULATORY_CARE_PROVIDER_SITE_OTHER): Payer: Medicare Other | Admitting: Nurse Practitioner

## 2022-09-05 ENCOUNTER — Encounter (INDEPENDENT_AMBULATORY_CARE_PROVIDER_SITE_OTHER): Payer: Self-pay | Admitting: Nurse Practitioner

## 2022-09-05 VITALS — BP 167/82 | HR 75 | Resp 17 | Ht 62.5 in | Wt 214.8 lb

## 2022-09-05 DIAGNOSIS — I83811 Varicose veins of right lower extremities with pain: Secondary | ICD-10-CM | POA: Diagnosis not present

## 2022-09-15 ENCOUNTER — Encounter (INDEPENDENT_AMBULATORY_CARE_PROVIDER_SITE_OTHER): Payer: Self-pay | Admitting: Nurse Practitioner

## 2022-09-15 NOTE — Progress Notes (Signed)
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   Procedure: Sclerotherapy using hypertonic saline mixed with 1% Lidocaine was performed on the right lower extremity. Compression wraps were placed. The patient tolerated the procedure well. 

## 2022-10-03 ENCOUNTER — Ambulatory Visit (INDEPENDENT_AMBULATORY_CARE_PROVIDER_SITE_OTHER): Payer: Medicare Other | Admitting: Nurse Practitioner

## 2022-10-03 ENCOUNTER — Encounter (INDEPENDENT_AMBULATORY_CARE_PROVIDER_SITE_OTHER): Payer: Self-pay | Admitting: Nurse Practitioner

## 2022-10-03 VITALS — BP 158/78 | HR 86 | Resp 16 | Wt 215.0 lb

## 2022-10-03 DIAGNOSIS — I83811 Varicose veins of right lower extremities with pain: Secondary | ICD-10-CM | POA: Diagnosis not present

## 2022-10-06 ENCOUNTER — Encounter (INDEPENDENT_AMBULATORY_CARE_PROVIDER_SITE_OTHER): Payer: Self-pay | Admitting: Nurse Practitioner

## 2022-10-06 NOTE — Progress Notes (Signed)
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   Procedure: Sclerotherapy using hypertonic saline mixed with 1% Lidocaine was performed on the right lower extremity. Compression wraps were placed. The patient tolerated the procedure well. 

## 2022-10-31 ENCOUNTER — Other Ambulatory Visit (INDEPENDENT_AMBULATORY_CARE_PROVIDER_SITE_OTHER): Payer: Self-pay | Admitting: Nurse Practitioner

## 2022-10-31 ENCOUNTER — Encounter (INDEPENDENT_AMBULATORY_CARE_PROVIDER_SITE_OTHER): Payer: Self-pay | Admitting: Nurse Practitioner

## 2022-10-31 ENCOUNTER — Ambulatory Visit (INDEPENDENT_AMBULATORY_CARE_PROVIDER_SITE_OTHER): Payer: Medicare Other

## 2022-10-31 ENCOUNTER — Ambulatory Visit (INDEPENDENT_AMBULATORY_CARE_PROVIDER_SITE_OTHER): Payer: Medicare Other | Admitting: Nurse Practitioner

## 2022-10-31 VITALS — BP 150/75 | HR 73 | Ht 66.0 in | Wt 220.0 lb

## 2022-10-31 DIAGNOSIS — I83811 Varicose veins of right lower extremities with pain: Secondary | ICD-10-CM

## 2022-10-31 DIAGNOSIS — M7989 Other specified soft tissue disorders: Secondary | ICD-10-CM

## 2022-10-31 DIAGNOSIS — M79604 Pain in right leg: Secondary | ICD-10-CM | POA: Diagnosis not present

## 2022-10-31 NOTE — Progress Notes (Signed)
Varicose veins of right lower extremity with inflammation (454.1  I83.10) Current Plans   Indication: Patient presents with symptomatic varicose veins of the right lower extremity.   Procedure: Sclerotherapy using hypertonic saline mixed with 1% Lidocaine was performed on the right lower extremity. Compression wraps were placed. The patient tolerated the procedure well.  The patient also noted prior to intervention today that she has some swelling and pain in her right knee and in the popliteal space.  We had the patient undergo a DVT study today and there is no evidence of DVT or any significant popliteal cyst.  I suspect the issues with the knee itself with arthritis.  The patient sclerotherapy is very superficial and not enough to cause any significant edema or swelling.  Patient is advised to follow with PCP for further evaluation.

## 2023-04-07 DIAGNOSIS — R030 Elevated blood-pressure reading, without diagnosis of hypertension: Secondary | ICD-10-CM | POA: Diagnosis not present

## 2023-04-07 DIAGNOSIS — S80862A Insect bite (nonvenomous), left lower leg, initial encounter: Secondary | ICD-10-CM | POA: Diagnosis not present

## 2023-04-07 DIAGNOSIS — L089 Local infection of the skin and subcutaneous tissue, unspecified: Secondary | ICD-10-CM | POA: Diagnosis not present

## 2023-04-07 DIAGNOSIS — W57XXXA Bitten or stung by nonvenomous insect and other nonvenomous arthropods, initial encounter: Secondary | ICD-10-CM | POA: Diagnosis not present

## 2024-02-12 ENCOUNTER — Telehealth (INDEPENDENT_AMBULATORY_CARE_PROVIDER_SITE_OTHER): Payer: Self-pay

## 2024-02-12 NOTE — Telephone Encounter (Signed)
 Patient called stating she had a Laser last year (02/20/23) and sometimes her leg is discolored, feels warms, hard, with a band around it, and it hurts and tender on the hard spots. This has been going on for a few months, but wasn't this way when she came in for her 4 week check up.  The discoloration is a constant red color and the size of a softball.  The pain is a sharp pain while sleeping and will start cramping when trying to get up. The tightness feels like a band is on the lower leg.  Please advise

## 2024-02-12 NOTE — Telephone Encounter (Signed)
 She actually underwent laser in 2023 and we haven't seen her in almost 18 months so there could certainly be something different going on.  WE can bring her in for reflux studies and abis

## 2024-02-16 ENCOUNTER — Other Ambulatory Visit (INDEPENDENT_AMBULATORY_CARE_PROVIDER_SITE_OTHER): Payer: Self-pay | Admitting: Nurse Practitioner

## 2024-02-16 DIAGNOSIS — M79606 Pain in leg, unspecified: Secondary | ICD-10-CM

## 2024-02-18 ENCOUNTER — Encounter (INDEPENDENT_AMBULATORY_CARE_PROVIDER_SITE_OTHER)

## 2024-02-18 ENCOUNTER — Ambulatory Visit (INDEPENDENT_AMBULATORY_CARE_PROVIDER_SITE_OTHER)

## 2024-02-18 DIAGNOSIS — M79604 Pain in right leg: Secondary | ICD-10-CM

## 2024-02-18 DIAGNOSIS — M79606 Pain in leg, unspecified: Secondary | ICD-10-CM

## 2024-03-05 ENCOUNTER — Encounter (INDEPENDENT_AMBULATORY_CARE_PROVIDER_SITE_OTHER): Payer: Self-pay | Admitting: Vascular Surgery

## 2024-03-05 ENCOUNTER — Ambulatory Visit (INDEPENDENT_AMBULATORY_CARE_PROVIDER_SITE_OTHER): Admitting: Vascular Surgery

## 2024-03-05 VITALS — BP 123/78 | HR 85 | Resp 17 | Ht 63.0 in | Wt 207.6 lb

## 2024-03-05 DIAGNOSIS — M79606 Pain in leg, unspecified: Secondary | ICD-10-CM

## 2024-03-05 DIAGNOSIS — I998 Other disorder of circulatory system: Secondary | ICD-10-CM | POA: Diagnosis not present

## 2024-03-05 NOTE — Progress Notes (Signed)
 Subjective:    Patient ID: April Manning, female    DOB: 02-02-1956, 68 y.o.   MRN: [id number] No chief complaint on file.   Patient returns today in follow up of venous insufficiency.  She underwent laser ablation of the right great saphenous vein about a back in 2022.  She is doing well overall. She recently has lost 30 pounds through diet and exercise with hormonal treatments. She does have painful residual varicosities in the right lower leg track anterior and laterally below the ablation site and correlate with prominent varicosity seen on exam.  She denies any fevers or chills.  Her postprocedural duplex showed successful ablation without DVT. However, she continues to have bilateral lower extremity swelling.  She has not been fully compliant with the conservative measures such as elevation rest and wearing compression stockings.    Review of Systems  Constitutional: Negative.   HENT: Negative.    Respiratory: Negative.    Cardiovascular: Negative.   Gastrointestinal: Negative.   Genitourinary: Negative.   Musculoskeletal:  Positive for joint swelling.       Has a history of carpal tunnel syndrome  Skin: Negative.   Neurological: Negative.   Psychiatric/Behavioral: Negative.    All other systems reviewed and are negative.      Objective:   Physical Exam Vitals reviewed.  Constitutional:      Appearance: Normal appearance. She is obese.  HENT:     Head: Normocephalic.  Eyes:     Pupils: Pupils are equal, round, and reactive to light.  Cardiovascular:     Rate and Rhythm: Normal rate and regular rhythm.     Pulses: Normal pulses.     Heart sounds: Normal heart sounds.  Pulmonary:     Effort: Pulmonary effort is normal.     Breath sounds: Normal breath sounds.  Abdominal:     General: Abdomen is flat. Bowel sounds are normal.     Palpations: Abdomen is soft.  Musculoskeletal:        General: Tenderness present.     Right lower leg: Edema present.     Left  lower leg: Edema present.  Skin:    General: Skin is warm and dry.     Capillary Refill: Capillary refill takes 2 to 3 seconds.  Neurological:     General: No focal deficit present.     Mental Status: She is alert and oriented to person, place, and time. Mental status is at baseline.  Psychiatric:        Mood and Affect: Mood normal.        Behavior: Behavior normal.        Thought Content: Thought content normal.        Judgment: Judgment normal.     BP 123/78 (BP Location: Left Arm, Patient Position: Sitting, Cuff Size: Normal)   Pulse 85   Resp 17   Ht 5\' 3"  (1.6 m)   Wt 207 lb 9.6 oz (94.2 kg)   BMI 36.77 kg/m   History reviewed. No pertinent past medical history.  Social History   Socioeconomic History   Marital status: Married    Spouse name: Not on file   Number of children: Not on file   Years of education: Not on file   Highest education level: Not on file  Occupational History   Not on file  Tobacco Use   Smoking status: Never    Passive exposure: Never   Smokeless tobacco: Never  Vaping Use  Vaping status: Never Used  Substance and Sexual Activity   Alcohol use: No   Drug use: No   Sexual activity: Not on file  Other Topics Concern   Not on file  Social History Narrative   Not on file   Social Drivers of Health   Financial Resource Strain: Not on file  Food Insecurity: Not on file  Transportation Needs: Not on file  Physical Activity: Not on file  Stress: Not on file  Social Connections: Not on file  Intimate Partner Violence: Not on file    Past Surgical History:  Procedure Laterality Date   FOOT SURGERY     ROTATOR CUFF REPAIR     TENDON REPAIR     TONSILLECTOMY      History reviewed. No pertinent family history.  Allergies  Allergen Reactions   Penicillins Hives       Latest Ref Rng & Units 03/07/2022   12:38 PM 09/04/2015    7:59 PM  CBC  WBC 4.0 - 10.5 K/uL 8.0  5.7   Hemoglobin 12.0 - 15.0 g/dL 16.1  09.6   Hematocrit  36.0 - 46.0 % 44.2  40.0   Platelets 150 - 400 K/uL 361  299       CMP     Component Value Date/Time   NA 139 03/07/2022 1238   K 4.2 03/07/2022 1238   CL 104 03/07/2022 1238   CO2 27 03/07/2022 1238   GLUCOSE 110 (H) 03/07/2022 1238   BUN 19 03/07/2022 1238   CREATININE 0.63 03/07/2022 1238   CALCIUM 9.6 03/07/2022 1238   PROT 7.3 03/07/2022 1238   ALBUMIN 3.8 03/07/2022 1238   AST 18 03/07/2022 1238   ALT 13 03/07/2022 1238   ALKPHOS 87 03/07/2022 1238   BILITOT 0.8 03/07/2022 1238   GFRNONAA >60 03/07/2022 1238     No results found.     Assessment & Plan:   1. Vascular insufficiency (Primary) Patient returns today in follow-up of venous insufficiency.  She underwent placement of the right great saphenous vein back in 2022.  Her postprocedural duplex showed successful ablation without DVT.  However she continues to have bilateral lower extremity swelling with pain.  However she has not been fully compliant with conservative measures.  A long discussion today concerning the conservative measures which was to rest and elevate her legs is much as possible.  We also discussed in detail the use of continuous compression stockings.  We discussed using them every day but not sleeping in them.  She agrees moving forward to be more compliant in this measure.  Patient wishes to engage in the conservative measures and will follow-up in 6 months for further evaluation.  She would like to hold off on any studies at this time.  I am okay with this as we can repeat studies after she follows up.  She feels that she continues to lose the weight that she needs she may not need further studies.  2. Leg pain, diffuse, unspecified laterality Patient is leg pain appears to be very diffuse based on her exercise and mobility of the day.  She does state that her leg pain goes away at night while resting.  Again we had a discussion related to instituting conservative measures such as rest elevation  and the use of compression stockings.  Verbalized her understanding and agrees to be more compliant.  She is also in the process of losing weight.  She has recently lost 30 pounds and states  that her legs feel better but she is not completely pain-free.  I encouraged that she continue to do so as this will very much help her vascular insufficiency and her ability to walk without problems.   No current outpatient medications on file prior to visit.   No current facility-administered medications on file prior to visit.    There are no Patient Instructions on file for this visit. No follow-ups on file.   Annamaria Barrette, NP

## 2024-09-07 ENCOUNTER — Ambulatory Visit (INDEPENDENT_AMBULATORY_CARE_PROVIDER_SITE_OTHER): Admitting: Vascular Surgery
